# Patient Record
Sex: Male | Born: 1960 | Race: White | Hispanic: No | Marital: Married | Smoking: Former smoker
Health system: Southern US, Community
[De-identification: ages and names within clinical notes are randomized; demographics above are authoritative.]

## PROBLEM LIST (undated history)

## (undated) DIAGNOSIS — R0602 Shortness of breath: Secondary | ICD-10-CM

## (undated) DIAGNOSIS — R7303 Prediabetes: Secondary | ICD-10-CM

## (undated) DIAGNOSIS — F419 Anxiety disorder, unspecified: Secondary | ICD-10-CM

## (undated) DIAGNOSIS — K829 Disease of gallbladder, unspecified: Secondary | ICD-10-CM

## (undated) DIAGNOSIS — I10 Essential (primary) hypertension: Secondary | ICD-10-CM

## (undated) DIAGNOSIS — M25569 Pain in unspecified knee: Secondary | ICD-10-CM

## (undated) DIAGNOSIS — E119 Type 2 diabetes mellitus without complications: Secondary | ICD-10-CM

## (undated) DIAGNOSIS — M1990 Unspecified osteoarthritis, unspecified site: Secondary | ICD-10-CM

## (undated) DIAGNOSIS — F329 Major depressive disorder, single episode, unspecified: Secondary | ICD-10-CM

## (undated) DIAGNOSIS — G4730 Sleep apnea, unspecified: Secondary | ICD-10-CM

## (undated) DIAGNOSIS — E785 Hyperlipidemia, unspecified: Secondary | ICD-10-CM

## (undated) DIAGNOSIS — F32A Depression, unspecified: Secondary | ICD-10-CM

## (undated) DIAGNOSIS — R5383 Other fatigue: Secondary | ICD-10-CM

## (undated) DIAGNOSIS — E039 Hypothyroidism, unspecified: Secondary | ICD-10-CM

## (undated) DIAGNOSIS — M2550 Pain in unspecified joint: Secondary | ICD-10-CM

## (undated) DIAGNOSIS — M25559 Pain in unspecified hip: Secondary | ICD-10-CM

## (undated) HISTORY — DX: Prediabetes: R73.03

## (undated) HISTORY — PX: APPENDECTOMY: SHX54

## (undated) HISTORY — PX: CHOLECYSTECTOMY: SHX55

## (undated) HISTORY — DX: Disease of gallbladder, unspecified: K82.9

## (undated) HISTORY — DX: Pain in unspecified hip: M25.559

## (undated) HISTORY — DX: Hyperlipidemia, unspecified: E78.5

## (undated) HISTORY — DX: Pain in unspecified knee: M25.569

## (undated) HISTORY — PX: MENISCUS REPAIR: SHX5179

## (undated) HISTORY — DX: Shortness of breath: R06.02

## (undated) HISTORY — DX: Other fatigue: R53.83

## (undated) HISTORY — DX: Pain in unspecified joint: M25.50

## (undated) HISTORY — DX: Sleep apnea, unspecified: G47.30

---

## 1992-02-07 HISTORY — PX: APPENDECTOMY: SHX54

## 1994-02-06 HISTORY — PX: CHOLECYSTECTOMY: SHX55

## 2000-02-07 HISTORY — PX: MENISCUS REPAIR: SHX5179

## 2015-02-23 ENCOUNTER — Other Ambulatory Visit: Payer: Self-pay | Admitting: Orthopedic Surgery

## 2015-02-23 DIAGNOSIS — M25331 Other instability, right wrist: Secondary | ICD-10-CM

## 2015-02-23 DIAGNOSIS — M25531 Pain in right wrist: Secondary | ICD-10-CM

## 2015-03-16 ENCOUNTER — Ambulatory Visit: Admission: RE | Admit: 2015-03-16 | Payer: Managed Care, Other (non HMO) | Source: Ambulatory Visit

## 2017-10-18 NOTE — Patient Instructions (Addendum)
Ryan Hancock  10/18/2017   Your procedure is scheduled IO:NGEXBMWon:Tuesday 10/30/2017   [facility]  [facility]              Report to admitting at  0610  AM    Call this number if you have problems the morning of surgery [phone]               Please bring your CPAP mask and tubing with you to the hospital!   Remember: Do not eat food or drink liquids :After Midnight.   BRUSH YOUR TEETH MORNING OF SURGERY AND RINSE YOUR MOUTH OUT, NO CHEWING GUM CANDY OR MINTS.     Take these medicines the morning of surgery with A SIP OF WATER: Bupropion (Wellbutrin XL), Levothyroxine (Synthroid), Sertraline (Zoloft)  DO NOT TAKE ANY DIABETIC MEDICATIONS DAY OF YOUR SURGERY                               You may not have any metal on your body including hair pins and              piercings  Do not wear jewelry, make-up, lotions, powders or perfumes, deodorant             Men may shave face and neck.   Do not bring valuables to the hospital. [facility] IS NOT             RESPONSIBLE   FOR VALUABLES.  Contacts, dentures or bridgework may not be worn into surgery.  Leave suitcase in the car. After surgery it may be brought to your room.                  Please read over the following fact sheets you were given: _____________________________________________________________________             [facility] - Preparing [facility] Before surgery, you can play an important role.  Because skin is not sterile, your skin needs to be as free of germs as possible.  You can reduce the number of germs on your skin by washing with CHG (chlorahexidine gluconate) soap before surgery.  CHG is an antiseptic cleaner which kills germs and bonds with the skin to continue killing germs even after washing. Please DO NOT use if you have an allergy to CHG or antibacterial soaps.  If your skin becomes reddened/irritated stop using the CHG and inform your nurse when you arrive at Short  Stay. Do not shave (including legs and underarms) for at least 48 hours prior to the first CHG shower.  You may shave your face/neck. Please follow these instructions carefully:  1.  Shower with CHG Soap the night before surgery and the  morning of Surgery.  2.  If you choose to wash your hair, wash your hair first as usual with your  normal  shampoo.  3.  After you shampoo, rinse your hair and body thoroughly to remove the  shampoo.                           4.  Use CHG as you would any other liquid soap.  You can apply chg directly  to the skin and wash  Gently with a scrungie or clean washcloth.  5.  Apply the CHG Soap to your body ONLY FROM THE NECK DOWN.   Do not use on face/ open                           Wound or open sores. Avoid contact with eyes, ears mouth and genitals (private parts).                       Wash face,  Genitals (private parts) with your normal soap.             6.  Wash thoroughly, paying special attention to the area where your surgery  will be performed.  7.  Thoroughly rinse your body with warm water from the neck down.  8.  DO NOT shower/wash with your normal soap after using and rinsing off  the CHG Soap.                9.  Pat yourself dry with a clean towel.            10.  Wear clean pajamas.            11.  Place clean sheets on your bed the night of your first shower and do not  sleep with pets. Day of Surgery : Do not apply any lotions/deodorants the morning of surgery.  Please wear clean clothes to the hospital/surgery center.  FAILURE TO FOLLOW THESE INSTRUCTIONS MAY RESULT IN THE CANCELLATION OF YOUR SURGERY PATIENT SIGNATURE_________________________________  NURSE SIGNATURE__________________________________  ________________________________________________________________________   Ryan Hancock  An incentive spirometer is a tool that can help keep your lungs clear and active. This tool measures how well you are  filling your lungs with each breath. Taking long deep breaths may help reverse or decrease the chance of developing breathing (pulmonary) problems (especially infection) following:  A long period of time when you are unable to move or be active. BEFORE THE PROCEDURE   If the spirometer includes an indicator to show your best effort, your nurse or respiratory therapist will set it to a desired goal.  If possible, sit up straight or lean slightly forward. Try not to slouch.  Hold the incentive spirometer in an upright position. INSTRUCTIONS FOR USE  1. Sit on the edge of your bed if possible, or sit up as far as you can in bed or on a chair. 2. Hold the incentive spirometer in an upright position. 3. Breathe out normally. 4. Place the mouthpiece in your mouth and seal your lips tightly around it. 5. Breathe in slowly and as deeply as possible, raising the piston or the ball toward the top of the column. 6. Hold your breath for 3-5 seconds or for as long as possible. Allow the piston or ball to fall to the bottom of the column. 7. Remove the mouthpiece from your mouth and breathe out normally. 8. Rest for a few seconds and repeat Steps 1 through 7 at least 10 times every 1-2 hours when you are awake. Take your time and take a few normal breaths between deep breaths. 9. The spirometer may include an indicator to show your best effort. Use the indicator as a goal to work toward during each repetition. 10. After each set of 10 deep breaths, practice coughing to be sure your lungs are clear. If you have an incision (the cut made at the time of surgery),  support your incision when coughing by placing a pillow or rolled up towels firmly against it. Once you are able to get out of bed, walk around indoors and cough well. You may stop using the incentive spirometer when instructed by your caregiver.  RISKS AND COMPLICATIONS  Take your time so you do not get dizzy or light-headed.  If you are in pain,  you may need to take or ask for pain medication before doing incentive spirometry. It is harder to take a deep breath if you are having pain. AFTER USE  Rest and breathe slowly and easily.  It can be helpful to keep track of a log of your progress. Your caregiver can provide you with a simple table to help with this. If you are using the spirometer at home, follow these instructions: [place] IF:   You are having difficultly using the spirometer.  You have trouble using the spirometer as often as instructed.  Your pain medication is not giving enough relief while using the spirometer.  You develop fever of 100.5 F (38.1 C) or higher. SEEK IMMEDIATE MEDICAL CARE IF:   You cough up bloody sputum that had not been present before.  You develop fever of 102 F (38.9 C) or greater.  You develop worsening pain at or near the incision site. MAKE SURE YOU:   Understand these instructions.  Will watch your condition.  Will get help right away if you are not doing well or get worse. Document Released: 06/05/2006 Document Revised: 04/17/2011 Document Reviewed: 08/06/2006 Exit[employer] Patient Information 2014 ExitCare, [place].   ________________________________________________________________________  WHAT IS A BLOOD TRANSFUSION? Blood Transfusion Information  A transfusion is the replacement of blood or some of its parts. Blood is made up of multiple cells which provide different functions.  Red blood cells carry oxygen and are used for blood loss replacement.  White blood cells fight against infection.  Platelets control bleeding.  Plasma helps clot blood.  Other blood products are available for specialized needs, such as hemophilia or other clotting disorders. BEFORE THE TRANSFUSION  Who gives blood for transfusions?   Healthy volunteers who are fully evaluated to make sure their blood is safe. This is blood bank blood. Transfusion therapy is the safest it has ever  been in the practice of medicine. Before blood is taken from a donor, a complete history is taken to make sure that person has no history of diseases nor engages in risky social behavior (examples are intravenous drug use or sexual activity with multiple partners). The donor's travel history is screened to minimize risk of transmitting infections, such as malaria. The donated blood is tested for signs of infectious diseases, such as HIV and hepatitis. The blood is then tested to be sure it is compatible with you in order to minimize the chance of a transfusion reaction. If you or a relative donates blood, this is often done in anticipation of surgery and is not appropriate for emergency situations. It takes many days to process the donated blood. RISKS AND COMPLICATIONS Although transfusion therapy is very safe and saves many lives, the main dangers of transfusion include:   Getting an infectious disease.  Developing a transfusion reaction. This is an allergic reaction to something in the blood you were given. Every precaution is taken to prevent this. The decision to have a blood transfusion has been considered carefully by your caregiver before blood is given. Blood is not given unless the benefits outweigh the risks. AFTER THE TRANSFUSION  Right after receiving a blood transfusion, you will usually feel much better and more energetic. This is especially true if your red blood cells have gotten low (anemic). The transfusion raises the level of the red blood cells which carry oxygen, and this usually causes an energy increase.  The nurse administering the transfusion will monitor you carefully for complications. HOME CARE INSTRUCTIONS  No special instructions are needed after a transfusion. You may find your energy is better. Speak with your caregiver about any limitations on activity for underlying diseases you may have. SEEK MEDICAL CARE IF:   Your condition is not improving after your  transfusion.  You develop redness or irritation at the intravenous (IV) site. SEEK IMMEDIATE MEDICAL CARE IF:  Any of the following symptoms occur over the next 12 hours:  Shaking chills.  You have a temperature by mouth above 102 F (38.9 C), not controlled by medicine.  Chest, back, or muscle pain.  People around you feel you are not acting correctly or are confused.  Shortness of breath or difficulty breathing.  Dizziness and fainting.  You get a rash or develop hives.  You have a decrease in urine output.  Your urine turns a dark color or changes to pink, red, or brown. Any of the following symptoms occur over the next 10 days:  You have a temperature by mouth above 102 F (38.9 C), not controlled by medicine.  Shortness of breath.  Weakness after normal activity.  The white part of the eye turns yellow (jaundice).  You have a decrease in the amount of urine or are urinating less often.  Your urine turns a dark color or changes to pink, red, or brown. Document Released: 01/21/2000 Document Revised: 04/17/2011 Document Reviewed: 09/09/2007 [facility] Patient Information 2014 [place], [place].  _______________________________________________________________________

## 2017-10-22 ENCOUNTER — Other Ambulatory Visit: Payer: Self-pay

## 2017-10-22 ENCOUNTER — Encounter (HOSPITAL_COMMUNITY): Payer: Self-pay

## 2017-10-22 ENCOUNTER — Encounter (HOSPITAL_COMMUNITY)
Admission: RE | Admit: 2017-10-22 | Discharge: 2017-10-22 | Disposition: A | Discharge status: Home / Self Care | Payer: BLUE CROSS/BLUE SHIELD | Source: Ambulatory Visit | Attending: Orthopedic Surgery | Admitting: Orthopedic Surgery

## 2017-10-22 DIAGNOSIS — I10 Essential (primary) hypertension: Secondary | ICD-10-CM | POA: Insufficient documentation

## 2017-10-22 DIAGNOSIS — Z01818 Encounter for other preprocedural examination: Secondary | ICD-10-CM | POA: Diagnosis present

## 2017-10-22 HISTORY — DX: Hypothyroidism, unspecified: E03.9

## 2017-10-22 HISTORY — DX: Major depressive disorder, single episode, unspecified: F32.9

## 2017-10-22 HISTORY — DX: Anxiety disorder, unspecified: F41.9

## 2017-10-22 HISTORY — DX: Essential (primary) hypertension: I10

## 2017-10-22 HISTORY — DX: Unspecified osteoarthritis, unspecified site: M19.90

## 2017-10-22 HISTORY — DX: Depression, unspecified: F32.A

## 2017-10-22 LAB — BASIC METABOLIC PANEL
Anion gap: 8 (ref 5–15)
BUN: 22 mg/dL — AB (ref 6–20)
CHLORIDE: 108 mmol/L (ref 98–111)
CO2: 28 mmol/L (ref 22–32)
CREATININE: 0.81 mg/dL (ref 0.61–1.24)
Calcium: 10 mg/dL (ref 8.9–10.3)
GFR calc non Af Amer: >60 mL/min (ref >60)
GLUCOSE: 128 mg/dL — AB (ref 70–99)
Potassium: 4.6 mmol/L (ref 3.5–5.1)
SODIUM: 144 mmol/L (ref 135–145)

## 2017-10-22 LAB — CBC
HCT: 41.3 % (ref 39.0–52.0)
Hemoglobin: 13.9 g/dL (ref 13.0–17.0)
MCH: 31.5 pg (ref 26.0–34.0)
MCHC: 33.7 g/dL (ref 30.0–36.0)
MCV: 93.7 fL (ref 78.0–100.0)
PLATELETS: 307 10*3/uL (ref 150–400)
RBC: 4.41 MIL/uL (ref 4.22–5.81)
RDW: 12.6 % (ref 11.5–15.5)
WBC: 6.4 10*3/uL (ref 4.0–10.5)

## 2017-10-22 LAB — SURGICAL PCR SCREEN
MRSA, PCR: NEGATIVE
STAPHYLOCOCCUS AUREUS: NEGATIVE

## 2017-10-22 LAB — ABO/RH: ABO/RH(D): A POS

## 2017-10-29 MED ORDER — DEXTROSE 5 % IV SOLN
3.0000 g | INTRAVENOUS | Status: AC
  Administered 2017-10-30: 3 g via INTRAVENOUS
  Filled 2017-10-29: qty 3

## 2017-10-29 NOTE — H&P (Signed)
TOTAL HIP ADMISSION H&P  Patient is admitted for left total hip arthroplasty, anterior approach.  Subjective:  Chief Complaint:   Left hip primary OA / pain  HPI: Ryan Hancock, 57 y.o. male, has a history of pain and functional disability in the left hip(s) due to arthritis and patient has failed non-surgical conservative treatments for greater than 12 weeks to include NSAID's and/or analgesics, corticosteriod injections and activity modification.  Onset of symptoms was gradual starting 8+ years ago with gradually worsening course since that time.The patient noted no past surgery on the left hip(s).  Patient currently rates pain in the left hip at 7 out of 10 with activity. Patient has night pain, worsening of pain with activity and weight bearing, trendelenberg gait, pain that interfers with activities of daily living and pain with passive range of motion. Patient has evidence of periarticular osteophytes and joint space narrowing by imaging studies. This condition presents safety issues increasing the risk of falls.   There is no current active infection.  Risks, benefits and expectations were discussed with the patient.  Risks including but not limited to the risk of anesthesia, blood clots, nerve damage, blood vessel damage, failure of the prosthesis, infection and up to and including death.  Patient understand the risks, benefits and expectations and wishes to proceed with surgery.   PCP: Dois Davenportichter, Karen L, MD  D/C Plans:       Home  Post-op Meds:       No Rx given   Tranexamic Acid:      To be given - IV   Decadron:      Is to be given  FYI:     ASA  Norco  CPAP  DME:   Rx given for - RW   PT:   No PT     Past Medical History:  Diagnosis Date  . Anxiety   . Arthritis   . Depression   . Hypertension   . Hypothyroidism     Past Surgical History:  Procedure Laterality Date  . APPENDECTOMY    . CHOLECYSTECTOMY    . MENISCUS REPAIR     right knee    No current  facility-administered medications for this encounter.    Current Outpatient Medications  Medication Sig Dispense Refill Last Dose  . acetaminophen (TYLENOL) 500 MG tablet Take 1,000 mg by mouth every 6 (six) hours as needed for moderate pain.   Past Week at Unknown time  . buPROPion (WELLBUTRIN XL) 150 MG 24 hr tablet Take 150 mg by mouth daily.   10/22/2017 at Unknown time  . ibuprofen (ADVIL,MOTRIN) 200 MG tablet Take 800 mg by mouth every 6 (six) hours as needed for moderate pain.   10/21/2017 at Unknown time  . levothyroxine (SYNTHROID, LEVOTHROID) 50 MCG tablet Take 50 mcg by mouth daily before breakfast.   10/22/2017 at 6:15am  . lisinopril (PRINIVIL,ZESTRIL) 10 MG tablet Take 10 mg by mouth daily.   10/22/2017 at Unknown time  . sertraline (ZOLOFT) 100 MG tablet Take 100 mg by mouth daily.    10/22/2017 at Unknown time  . TURMERIC PO Take 2 tablets by mouth every morning.   Past Week at Unknown time   No Known Allergies  Social History   Tobacco Use  . Smoking status: Former Smoker    Types: Cigarettes    Last attempt to quit: 03/20/1984    Years since quitting: 33.6  . Smokeless tobacco: Never Used  Substance Use Topics  . Alcohol use:  Yes    Comment: occassionally       Review of Systems  Constitutional: Negative.   HENT: Negative.   Eyes: Negative.   Respiratory: Negative.   Cardiovascular: Negative.   Gastrointestinal: Negative.   Genitourinary: Negative.   Musculoskeletal: Positive for joint pain.  Skin: Negative.   Neurological: Negative.   Endo/Heme/Allergies: Negative.   Psychiatric/Behavioral: Positive for depression. The patient is nervous/anxious.     Objective:  Physical Exam  Constitutional: He is oriented to person, place, and time. He appears well-developed.  HENT:  Head: Normocephalic.  Eyes: Pupils are equal, round, and reactive to light.  Neck: Neck supple. No JVD present. No tracheal deviation present. No thyromegaly present.  Cardiovascular:  Normal rate, regular rhythm and intact distal pulses.  Respiratory: Effort normal and breath sounds normal. No respiratory distress. He has no wheezes.  GI: Soft. There is no tenderness. There is no guarding.  Musculoskeletal:       Left hip: He exhibits decreased range of motion, decreased strength, tenderness and bony tenderness. He exhibits no swelling, no deformity and no laceration.  Lymphadenopathy:    He has no cervical adenopathy.  Neurological: He is alert and oriented to person, place, and time.  Skin: Skin is warm and dry.  Psychiatric: He has a normal mood and affect.      Labs:  Estimated body mass index is 38.71 kg/m as calculated from the following:   Height as of 10/22/17: 6' (1.829 m).   Weight as of 10/22/17: 129.5 kg.   Imaging Review Plain radiographs demonstrate severe degenerative joint disease of the left hip. The bone quality appears to be good for age and reported activity level.    Preoperative templating of the joint replacement has been completed, documented, and submitted to the Operating Room personnel in order to optimize intra-operative equipment management.     Assessment/Plan:  End stage arthritis, left hip  The patient history, physical examination, clinical judgement of the provider and imaging studies are consistent with end stage degenerative joint disease of the left hip and total hip arthroplasty is deemed medically necessary. The treatment options including medical management, injection therapy, arthroscopy and arthroplasty were discussed at length. The risks and benefits of total hip arthroplasty were presented and reviewed. The risks due to aseptic loosening, infection, stiffness, dislocation/subluxation,  thromboembolic complications and other imponderables were discussed.  The patient acknowledged the explanation, agreed to proceed with the plan and consent was signed. Patient is being admitted for inpatient treatment for surgery, pain  control, PT, OT, prophylactic antibiotics, VTE prophylaxis, progressive ambulation and ADL's and discharge planning.The patient is planning to be discharged home.      Anastasio Auerbach Jerrico Covello   PA-C  10/29/2017, 9:31 AM

## 2017-10-30 ENCOUNTER — Ambulatory Visit (HOSPITAL_COMMUNITY): Payer: BLUE CROSS/BLUE SHIELD | Admitting: Anesthesiology

## 2017-10-30 ENCOUNTER — Ambulatory Visit (HOSPITAL_COMMUNITY): Payer: BLUE CROSS/BLUE SHIELD

## 2017-10-30 ENCOUNTER — Encounter (HOSPITAL_COMMUNITY): Payer: Self-pay | Admitting: *Deleted

## 2017-10-30 ENCOUNTER — Other Ambulatory Visit: Payer: Self-pay

## 2017-10-30 ENCOUNTER — Encounter (HOSPITAL_COMMUNITY)
Admission: RE | Disposition: A | Discharge status: Home / Self Care | Payer: Self-pay | Source: Ambulatory Visit | Attending: Orthopedic Surgery

## 2017-10-30 ENCOUNTER — Observation Stay (HOSPITAL_COMMUNITY)
Admission: RE | Admit: 2017-10-30 | Discharge: 2017-10-31 | Disposition: A | Discharge status: Home / Self Care | Payer: BLUE CROSS/BLUE SHIELD | Source: Ambulatory Visit | Attending: Orthopedic Surgery | Admitting: Orthopedic Surgery

## 2017-10-30 ENCOUNTER — Inpatient Hospital Stay (HOSPITAL_COMMUNITY): Payer: BLUE CROSS/BLUE SHIELD

## 2017-10-30 DIAGNOSIS — M1612 Unilateral primary osteoarthritis, left hip: Principal | ICD-10-CM | POA: Insufficient documentation

## 2017-10-30 DIAGNOSIS — F329 Major depressive disorder, single episode, unspecified: Secondary | ICD-10-CM | POA: Diagnosis not present

## 2017-10-30 DIAGNOSIS — E669 Obesity, unspecified: Secondary | ICD-10-CM | POA: Diagnosis present

## 2017-10-30 DIAGNOSIS — Z96649 Presence of unspecified artificial hip joint: Secondary | ICD-10-CM

## 2017-10-30 DIAGNOSIS — Z96642 Presence of left artificial hip joint: Secondary | ICD-10-CM

## 2017-10-30 DIAGNOSIS — E039 Hypothyroidism, unspecified: Secondary | ICD-10-CM | POA: Diagnosis not present

## 2017-10-30 DIAGNOSIS — Z6838 Body mass index (BMI) 38.0-38.9, adult: Secondary | ICD-10-CM | POA: Insufficient documentation

## 2017-10-30 DIAGNOSIS — Z79899 Other long term (current) drug therapy: Secondary | ICD-10-CM | POA: Insufficient documentation

## 2017-10-30 DIAGNOSIS — F419 Anxiety disorder, unspecified: Secondary | ICD-10-CM | POA: Diagnosis not present

## 2017-10-30 DIAGNOSIS — Z87891 Personal history of nicotine dependence: Secondary | ICD-10-CM | POA: Diagnosis not present

## 2017-10-30 DIAGNOSIS — Z79890 Hormone replacement therapy (postmenopausal): Secondary | ICD-10-CM | POA: Insufficient documentation

## 2017-10-30 DIAGNOSIS — I10 Essential (primary) hypertension: Secondary | ICD-10-CM | POA: Diagnosis not present

## 2017-10-30 HISTORY — PX: TOTAL HIP ARTHROPLASTY: SHX124

## 2017-10-30 LAB — TYPE AND SCREEN
ABO/RH(D): A POS
Antibody Screen: NEGATIVE

## 2017-10-30 SURGERY — ARTHROPLASTY, HIP, TOTAL, ANTERIOR APPROACH
Anesthesia: Spinal | Site: Hip | Laterality: Left

## 2017-10-30 MED ORDER — FERROUS SULFATE 325 (65 FE) MG PO TABS
325.0000 mg | ORAL_TABLET | Freq: Three times a day (TID) | ORAL | Status: DC
  Administered 2017-10-31: 325 mg via ORAL
  Filled 2017-10-30: qty 1

## 2017-10-30 MED ORDER — PHENOL 1.4 % MT LIQD
1.0000 | OROMUCOSAL | Status: DC | PRN
  Filled 2017-10-30: qty 177

## 2017-10-30 MED ORDER — METHOCARBAMOL 500 MG PO TABS: 500.0000 mg | ORAL_TABLET | Freq: Four times a day (QID) | ORAL | 0 refills | Status: DC | PRN

## 2017-10-30 MED ORDER — ONDANSETRON HCL 4 MG/2ML IJ SOLN
INTRAMUSCULAR | Status: AC
  Filled 2017-10-30: qty 2

## 2017-10-30 MED ORDER — METHOCARBAMOL 500 MG PO TABS
500.0000 mg | ORAL_TABLET | Freq: Four times a day (QID) | ORAL | Status: DC | PRN
  Administered 2017-10-30 – 2017-10-31 (×2): 500 mg via ORAL
  Filled 2017-10-30 (×2): qty 1

## 2017-10-30 MED ORDER — BUPROPION HCL ER (XL) 150 MG PO TB24
150.0000 mg | ORAL_TABLET | Freq: Every day | ORAL | Status: DC
  Administered 2017-10-31: 150 mg via ORAL
  Filled 2017-10-30: qty 1

## 2017-10-30 MED ORDER — FENTANYL CITRATE (PF) 100 MCG/2ML IJ SOLN
INTRAMUSCULAR | Status: DC | PRN
  Administered 2017-10-30: 100 ug via INTRAVENOUS

## 2017-10-30 MED ORDER — MORPHINE SULFATE (PF) 2 MG/ML IV SOLN: 0.5000 mg | INTRAVENOUS | Status: DC | PRN

## 2017-10-30 MED ORDER — POLYETHYLENE GLYCOL 3350 17 G PO PACK
17.0000 g | PACK | Freq: Two times a day (BID) | ORAL | Status: DC
  Filled 2017-10-30 (×2): qty 1

## 2017-10-30 MED ORDER — CEFAZOLIN SODIUM-DEXTROSE 2-4 GM/100ML-% IV SOLN
2.0000 g | Freq: Four times a day (QID) | INTRAVENOUS | Status: AC
  Administered 2017-10-30 (×2): 2 g via INTRAVENOUS
  Filled 2017-10-30 (×2): qty 100

## 2017-10-30 MED ORDER — PROPOFOL 10 MG/ML IV BOLUS
INTRAVENOUS | Status: AC
  Filled 2017-10-30: qty 20

## 2017-10-30 MED ORDER — PROMETHAZINE HCL 25 MG/ML IJ SOLN
6.2500 mg | INTRAMUSCULAR | Status: DC | PRN
  Administered 2017-10-30: 6.25 mg via INTRAVENOUS

## 2017-10-30 MED ORDER — TRANEXAMIC ACID 1000 MG/10ML IV SOLN
1000.0000 mg | Freq: Once | INTRAVENOUS | Status: AC
  Administered 2017-10-30: 1000 mg via INTRAVENOUS
  Filled 2017-10-30: qty 1000

## 2017-10-30 MED ORDER — CHLORHEXIDINE GLUCONATE 4 % EX LIQD: 60.0000 mL | Freq: Once | CUTANEOUS | Status: DC

## 2017-10-30 MED ORDER — PROMETHAZINE HCL 25 MG/ML IJ SOLN
INTRAMUSCULAR | Status: AC
  Filled 2017-10-30: qty 1

## 2017-10-30 MED ORDER — ACETAMINOPHEN 10 MG/ML IV SOLN
1000.0000 mg | Freq: Once | INTRAVENOUS | Status: AC
  Administered 2017-10-30: 1000 mg via INTRAVENOUS

## 2017-10-30 MED ORDER — ACETAMINOPHEN 10 MG/ML IV SOLN
INTRAVENOUS | Status: AC
  Filled 2017-10-30: qty 100

## 2017-10-30 MED ORDER — TRANEXAMIC ACID 1000 MG/10ML IV SOLN
1000.0000 mg | INTRAVENOUS | Status: AC
  Administered 2017-10-30: 1000 mg via INTRAVENOUS
  Filled 2017-10-30: qty 10

## 2017-10-30 MED ORDER — METOCLOPRAMIDE HCL 5 MG/ML IJ SOLN: 5.0000 mg | Freq: Three times a day (TID) | INTRAMUSCULAR | Status: DC | PRN

## 2017-10-30 MED ORDER — DIPHENHYDRAMINE HCL 12.5 MG/5ML PO ELIX: 12.5000 mg | ORAL_SOLUTION | ORAL | Status: DC | PRN

## 2017-10-30 MED ORDER — HYDROMORPHONE HCL 1 MG/ML IJ SOLN
0.2500 mg | INTRAMUSCULAR | Status: DC | PRN
  Administered 2017-10-30: 0.25 mg via INTRAVENOUS

## 2017-10-30 MED ORDER — MIDAZOLAM HCL 2 MG/2ML IJ SOLN
INTRAMUSCULAR | Status: AC
  Filled 2017-10-30: qty 2

## 2017-10-30 MED ORDER — KETOROLAC TROMETHAMINE 30 MG/ML IJ SOLN
INTRAMUSCULAR | Status: AC
  Filled 2017-10-30: qty 1

## 2017-10-30 MED ORDER — ONDANSETRON HCL 4 MG/2ML IJ SOLN
INTRAMUSCULAR | Status: DC | PRN
  Administered 2017-10-30 (×2): 4 mg via INTRAVENOUS

## 2017-10-30 MED ORDER — ASPIRIN 81 MG PO CHEW
81.0000 mg | CHEWABLE_TABLET | Freq: Two times a day (BID) | ORAL | Status: DC
  Administered 2017-10-30 – 2017-10-31 (×2): 81 mg via ORAL
  Filled 2017-10-30 (×2): qty 1

## 2017-10-30 MED ORDER — METOCLOPRAMIDE HCL 5 MG PO TABS: 5.0000 mg | ORAL_TABLET | Freq: Three times a day (TID) | ORAL | Status: DC | PRN

## 2017-10-30 MED ORDER — HYDROMORPHONE HCL 1 MG/ML IJ SOLN
INTRAMUSCULAR | Status: AC
  Filled 2017-10-30: qty 1

## 2017-10-30 MED ORDER — BISACODYL 10 MG RE SUPP: 10.0000 mg | Freq: Every day | RECTAL | Status: DC | PRN

## 2017-10-30 MED ORDER — BUPIVACAINE IN DEXTROSE 0.75-8.25 % IT SOLN
INTRATHECAL | Status: DC | PRN
  Administered 2017-10-30: 2 mL via INTRATHECAL

## 2017-10-30 MED ORDER — POLYETHYLENE GLYCOL 3350 17 G PO PACK: 17.0000 g | PACK | Freq: Two times a day (BID) | ORAL | 0 refills | Status: DC

## 2017-10-30 MED ORDER — DOCUSATE SODIUM 100 MG PO CAPS
100.0000 mg | ORAL_CAPSULE | Freq: Two times a day (BID) | ORAL | Status: DC
  Administered 2017-10-30 – 2017-10-31 (×2): 100 mg via ORAL
  Filled 2017-10-30 (×2): qty 1

## 2017-10-30 MED ORDER — ONDANSETRON HCL 4 MG PO TABS: 4.0000 mg | ORAL_TABLET | Freq: Four times a day (QID) | ORAL | Status: DC | PRN

## 2017-10-30 MED ORDER — KETOROLAC TROMETHAMINE 30 MG/ML IJ SOLN
30.0000 mg | Freq: Once | INTRAMUSCULAR | Status: AC
  Administered 2017-10-30: 30 mg via INTRAVENOUS

## 2017-10-30 MED ORDER — ALUM & MAG HYDROXIDE-SIMETH 200-200-20 MG/5ML PO SUSP: 15.0000 mL | ORAL | Status: DC | PRN

## 2017-10-30 MED ORDER — STERILE WATER FOR IRRIGATION IR SOLN
Status: DC | PRN
  Administered 2017-10-30: 2000 mL

## 2017-10-30 MED ORDER — PROPOFOL 500 MG/50ML IV EMUL
INTRAVENOUS | Status: DC | PRN
  Administered 2017-10-30: 50 ug/kg/min via INTRAVENOUS

## 2017-10-30 MED ORDER — MIDAZOLAM HCL 2 MG/2ML IJ SOLN
INTRAMUSCULAR | Status: DC | PRN
  Administered 2017-10-30: 2 mg via INTRAVENOUS
  Administered 2017-10-30 (×4): 0.5 mg via INTRAVENOUS

## 2017-10-30 MED ORDER — CELECOXIB 200 MG PO CAPS
200.0000 mg | ORAL_CAPSULE | Freq: Two times a day (BID) | ORAL | Status: DC
  Administered 2017-10-30 – 2017-10-31 (×3): 200 mg via ORAL
  Filled 2017-10-30 (×3): qty 1

## 2017-10-30 MED ORDER — ASPIRIN 81 MG PO CHEW: 81.0000 mg | CHEWABLE_TABLET | Freq: Two times a day (BID) | ORAL | 0 refills | Status: AC

## 2017-10-30 MED ORDER — LACTATED RINGERS IV SOLN
INTRAVENOUS | Status: DC
  Administered 2017-10-30 (×2): via INTRAVENOUS

## 2017-10-30 MED ORDER — PROPOFOL 10 MG/ML IV BOLUS
INTRAVENOUS | Status: AC
  Filled 2017-10-30: qty 60

## 2017-10-30 MED ORDER — DEXAMETHASONE SODIUM PHOSPHATE 10 MG/ML IJ SOLN
10.0000 mg | Freq: Once | INTRAMUSCULAR | Status: AC
  Administered 2017-10-31: 10 mg via INTRAVENOUS
  Filled 2017-10-30: qty 1

## 2017-10-30 MED ORDER — MAGNESIUM CITRATE PO SOLN: 1.0000 | Freq: Once | ORAL | Status: DC | PRN

## 2017-10-30 MED ORDER — DEXAMETHASONE SODIUM PHOSPHATE 10 MG/ML IJ SOLN
10.0000 mg | Freq: Once | INTRAMUSCULAR | Status: AC
  Administered 2017-10-30: 10 mg via INTRAVENOUS

## 2017-10-30 MED ORDER — DEXAMETHASONE SODIUM PHOSPHATE 10 MG/ML IJ SOLN
INTRAMUSCULAR | Status: AC
  Filled 2017-10-30: qty 1

## 2017-10-30 MED ORDER — FENTANYL CITRATE (PF) 100 MCG/2ML IJ SOLN
INTRAMUSCULAR | Status: AC
  Filled 2017-10-30: qty 2

## 2017-10-30 MED ORDER — SODIUM CHLORIDE 0.9 % IR SOLN
Status: DC | PRN
  Administered 2017-10-30: 1000 mL

## 2017-10-30 MED ORDER — HYDROCODONE-ACETAMINOPHEN 7.5-325 MG PO TABS: 1.0000 | ORAL_TABLET | ORAL | Status: DC | PRN

## 2017-10-30 MED ORDER — SERTRALINE HCL 100 MG PO TABS
100.0000 mg | ORAL_TABLET | Freq: Every day | ORAL | Status: DC
  Administered 2017-10-31: 100 mg via ORAL
  Filled 2017-10-30: qty 1

## 2017-10-30 MED ORDER — DOCUSATE SODIUM 100 MG PO CAPS: 100.0000 mg | ORAL_CAPSULE | Freq: Two times a day (BID) | ORAL | 0 refills | Status: DC

## 2017-10-30 MED ORDER — MENTHOL 3 MG MT LOZG: 1.0000 | LOZENGE | OROMUCOSAL | Status: DC | PRN

## 2017-10-30 MED ORDER — SODIUM CHLORIDE 0.9 % IV SOLN
INTRAVENOUS | Status: DC
  Administered 2017-10-30 – 2017-10-31 (×3): via INTRAVENOUS

## 2017-10-30 MED ORDER — ACETAMINOPHEN 325 MG PO TABS: 325.0000 mg | ORAL_TABLET | Freq: Four times a day (QID) | ORAL | Status: DC | PRN

## 2017-10-30 MED ORDER — FERROUS SULFATE 325 (65 FE) MG PO TABS: 325.0000 mg | ORAL_TABLET | Freq: Three times a day (TID) | ORAL | 3 refills | Status: DC

## 2017-10-30 MED ORDER — ACETAMINOPHEN 10 MG/ML IV SOLN: 1000.0000 mg | Freq: Once | INTRAVENOUS | Status: DC | PRN

## 2017-10-30 MED ORDER — ONDANSETRON HCL 4 MG/2ML IJ SOLN: 4.0000 mg | Freq: Four times a day (QID) | INTRAMUSCULAR | Status: DC | PRN

## 2017-10-30 MED ORDER — METHOCARBAMOL 500 MG IVPB - SIMPLE MED
INTRAVENOUS | Status: AC
  Filled 2017-10-30: qty 50

## 2017-10-30 MED ORDER — LEVOTHYROXINE SODIUM 50 MCG PO TABS
50.0000 ug | ORAL_TABLET | Freq: Every day | ORAL | Status: DC
  Administered 2017-10-31: 50 ug via ORAL
  Filled 2017-10-30: qty 1

## 2017-10-30 MED ORDER — METHOCARBAMOL 500 MG IVPB - SIMPLE MED
500.0000 mg | Freq: Four times a day (QID) | INTRAVENOUS | Status: DC | PRN
  Administered 2017-10-30: 500 mg via INTRAVENOUS
  Filled 2017-10-30: qty 50

## 2017-10-30 MED ORDER — HYDROCODONE-ACETAMINOPHEN 7.5-325 MG PO TABS: 1.0000 | ORAL_TABLET | ORAL | 0 refills | Status: DC | PRN

## 2017-10-30 MED ORDER — HYDROCODONE-ACETAMINOPHEN 5-325 MG PO TABS
1.0000 | ORAL_TABLET | ORAL | Status: DC | PRN
  Administered 2017-10-30: 1 via ORAL
  Administered 2017-10-30 – 2017-10-31 (×2): 2 via ORAL
  Filled 2017-10-30: qty 1
  Filled 2017-10-30 (×2): qty 2

## 2017-10-30 MED ORDER — PROPOFOL 10 MG/ML IV BOLUS
INTRAVENOUS | Status: DC | PRN
  Administered 2017-10-30 (×3): 20 mg via INTRAVENOUS

## 2017-10-30 SURGICAL SUPPLY — 41 items
BAG DECANTER FOR FLEXI CONT (MISCELLANEOUS) IMPLANT
BAG ZIPLOCK 12X15 (MISCELLANEOUS) IMPLANT
BLADE SAG 18X100X1.27 (BLADE) ×2 IMPLANT
COVER PERINEAL POST (MISCELLANEOUS) ×2 IMPLANT
COVER SURGICAL LIGHT HANDLE (MISCELLANEOUS) ×2 IMPLANT
CUP ACETBLR 54 OD PINNACLE (Hips) ×2 IMPLANT
DERMABOND ADVANCED (GAUZE/BANDAGES/DRESSINGS) ×1
DERMABOND ADVANCED .7 DNX12 (GAUZE/BANDAGES/DRESSINGS) ×1 IMPLANT
DRAPE STERI IOBAN 125X83 (DRAPES) ×2 IMPLANT
DRAPE U-SHAPE 47X51 STRL (DRAPES) ×4 IMPLANT
DRESSING AQUACEL AG SP 3.5X10 (GAUZE/BANDAGES/DRESSINGS) ×1 IMPLANT
DRSG AQUACEL AG SP 3.5X10 (GAUZE/BANDAGES/DRESSINGS) ×2
DURAPREP 26ML APPLICATOR (WOUND CARE) ×2 IMPLANT
ELECT REM PT RETURN 15FT ADLT (MISCELLANEOUS) ×2 IMPLANT
ELIMINATOR HOLE APEX DEPUY (Hips) ×2 IMPLANT
GLOVE BIOGEL M STRL SZ7.5 (GLOVE) ×4 IMPLANT
GLOVE BIOGEL PI IND STRL 7.5 (GLOVE) ×2 IMPLANT
GLOVE BIOGEL PI IND STRL 8.5 (GLOVE) IMPLANT
GLOVE BIOGEL PI INDICATOR 7.5 (GLOVE) ×2
GLOVE BIOGEL PI INDICATOR 8.5 (GLOVE)
GLOVE ECLIPSE 8.0 STRL XLNG CF (GLOVE) IMPLANT
GLOVE ORTHO TXT STRL SZ7.5 (GLOVE) ×2 IMPLANT
GOWN STRL REUS W/TWL 2XL LVL3 (GOWN DISPOSABLE) ×2 IMPLANT
GOWN STRL REUS W/TWL LRG LVL3 (GOWN DISPOSABLE) ×2 IMPLANT
HEAD CERAMIC DELTA 36 PLUS 1.5 (Hips) ×2 IMPLANT
HOLDER FOLEY CATH W/STRAP (MISCELLANEOUS) ×2 IMPLANT
LINER NEUTRAL 54X36MM PLUS 4 (Hips) ×2 IMPLANT
PACK ANTERIOR HIP CUSTOM (KITS) ×2 IMPLANT
SCREW 6.5MMX25MM (Screw) ×2 IMPLANT
STEM TRI LOC BPS GRIPTON SZ 5 (Hips) ×1 IMPLANT
SUT MNCRL AB 4-0 PS2 18 (SUTURE) ×2 IMPLANT
SUT STRATAFIX 0 PDS 27 VIOLET (SUTURE) ×2
SUT VIC AB 1 CT1 36 (SUTURE) ×6 IMPLANT
SUT VIC AB 2-0 CT1 27 (SUTURE) ×2
SUT VIC AB 2-0 CT1 TAPERPNT 27 (SUTURE) ×2 IMPLANT
SUTURE STRATFX 0 PDS 27 VIOLET (SUTURE) ×1 IMPLANT
TRAY FOLEY CATH 14FR (SET/KITS/TRAYS/PACK) ×2 IMPLANT
TRAY FOLEY MTR SLVR 16FR STAT (SET/KITS/TRAYS/PACK) ×2 IMPLANT
TRI LOC BPS W GRIPTON SZ 5 (Hips) ×2 IMPLANT
WATER STERILE IRR 1000ML POUR (IV SOLUTION) ×4 IMPLANT
YANKAUER SUCT BULB TIP 10FT TU (MISCELLANEOUS) ×2 IMPLANT

## 2017-10-30 NOTE — Op Note (Signed)
NAME:  Ryan Hancock                ACCOUNT NO.: [id number]      MEDICAL RECORD NO.: [id number]      [facility]      PHYSICIAN:  Shelda PalMatthew D Odester Nilson  DATE OF BIRTH:  03/10/1960     DATE OF PROCEDURE:  10/30/2017                                 OPERATIVE REPORT         PREOPERATIVE DIAGNOSIS: Left  hip osteoarthritis.      POSTOPERATIVE DIAGNOSIS:  Left hip osteoarthritis.      PROCEDURE:  Left total hip replacement through an anterior approach   utilizing DePuy THR system, component size 54mm pinnacle cup, a size 36+4 neutral   Altrex liner, a size 5 Hi Tri Lock stem with a 36+1.5 delta ceramic   ball.      SURGEON:  Madlyn FrankelMatthew D. Charlann Boxerlin, M.D.      ASSISTANT:  Skip MayerBlair Roberts, PA-C     ANESTHESIA:  Spinal.      SPECIMENS:  None.      COMPLICATIONS:  None.      BLOOD LOSS:  700 cc     DRAINS:  None.      INDICATION OF THE PROCEDURE:  Ryan Hancock is a 57 y.o. male who had   presented to office for evaluation of left hip pain.  Radiographs revealed   progressive degenerative changes with bone-on-bone   articulation of the  hip joint, including subchondral cystic changes and osteophytes.  The patient had painful limited range of   motion significantly affecting their overall quality of life and function.  The patient was failing to    respond to conservative measures including medications and/or injections and activity modification and at this point was ready   to proceed with more definitive measures.  Consent was obtained for   benefit of pain relief.  Specific risks of infection, DVT, component   failure, dislocation, neurovascular injury, and need for revision surgery were reviewed in the office as well discussion of   the anterior versus posterior approach were reviewed.     PROCEDURE IN DETAIL:  The patient was brought to operative theater.   Once adequate anesthesia, preoperative antibiotics, 2 gm of Ancef, 1 gm of Tranexamic Acid, and 10 mg of  Decadron were administered, the patient was positioned supine on the [employer] table.  Once the patient was safely positioned with adequate padding of boney prominences we predraped out the hip, and used fluoroscopy to confirm orientation of the pelvis.      The left hip was then prepped and draped from proximal iliac crest to   mid thigh with a shower curtain technique.      Time-out was performed identifying the patient, planned procedure, and the appropriate extremity.     An incision was then made 2 cm lateral to the   anterior superior iliac spine extending over the orientation of the   tensor fascia lata muscle and sharp dissection was carried down to the   fascia of the muscle.      The fascia was then incised.  The muscle belly was identified and swept   laterally and retractor placed along the superior neck.  Following   cauterization of the circumflex vessels and removing some pericapsular  fat, a second cobra retractor was placed on the inferior neck.  A T-capsulotomy was made along the line of the   superior neck to the trochanteric fossa, then extended proximally and   distally.  Tag sutures were placed and the retractors were then placed   intracapsular.  We then identified the trochanteric fossa and   orientation of my neck cut and then made a neck osteotomy with the femur on traction.  The femoral   head was removed without difficulty or complication.  Traction was let   off and retractors were placed posterior and anterior around the   acetabulum.      The labrum and foveal tissue were debrided.  I began reaming with a 46 mm   reamer and reamed up to 53 mm reamer with good bony bed preparation and a 54 mm  cup was chosen.  The final 54 mm Pinnacle cup was then impacted under fluoroscopy to confirm the depth of penetration and orientation with respect to   Abduction and forward flexion.  A screw was placed into the ilium followed by the hole eliminator.  The final   36+4  neutral Altrex liner was impacted with good visualized rim fit.  The cup was positioned anatomically within the acetabular portion of the pelvis.      At this point, the femur was rolled to 100 degrees.  Further capsule was   released off the inferior aspect of the femoral neck.  I then   released the superior capsule proximally.  With the leg in a neutral position the hook was placed laterally   along the femur under the vastus lateralis origin and elevated manually and then held in position using the hook attachment on the bed.  The leg was then extended and adducted with the leg rolled to 100   degrees of external rotation.  Retractors were placed along the medial calcar and posteriorly over the greater trochanter.  Once the proximal femur was fully   exposed, I used a box osteotome to set orientation.  I then began   broaching with the starting chili pepper broach and passed this by hand and then broached up to 5.  With the 5 broach in place I chose a high offset neck and did several trial reductions.  The offset was appropriate, leg lengths   appeared to be equal best matched with the +1.5 head ball trial confirmed radiographically.   Given these findings, I went ahead and dislocated the hip, repositioned all   retractors and positioned the right hip in the extended and abducted position.  The final 5 Hi Tri Lock stem was   chosen and it was impacted down to the level of neck cut.  Based on this   and the trial reductions, a final 36+1.5 delta ceramic ball was chosen and   impacted onto a clean and dry trunnion, and the hip was reduced.  The   hip had been irrigated throughout the case again at this point.  I did   reapproximate the superior capsular leaflet to the anterior leaflet   using #1 Vicryl.  The fascia of the   tensor fascia lata muscle was then reapproximated using #1 Vicryl and #0 Stratafix sutures.  The   remaining wound was closed with 2-0 Vicryl and running 4-0 Monocryl.    The hip was cleaned, dried, and dressed sterilely using Dermabond and   Aquacel dressing.  The patient was then brought   to recovery room  in stable condition tolerating the procedure well.    Skip Mayer, PA-C was present for the entirety of the case involved from   preoperative positioning, perioperative retractor management, general   facilitation of the case, as well as primary wound closure as assistant.            Madlyn Frankel Charlann Boxer, M.D.        10/30/2017 10:18 AM

## 2017-10-30 NOTE — Anesthesia Procedure Notes (Signed)
Spinal  Patient location during procedure: OR Start time: 10/30/2017 8:36 AM End time: 10/30/2017 8:45 AM Staffing Anesthesiologist: Myrtie Soman, MD Performed: anesthesiologist  Preanesthetic Checklist Completed: patient identified, site marked, surgical consent, pre-op evaluation, timeout performed, IV checked, risks and benefits discussed and monitors and equipment checked Spinal Block Patient position: sitting Prep: ChloraPrep Patient monitoring: heart rate, continuous pulse ox and blood pressure Location: L3-4 Injection technique: single-shot Needle Needle type: Sprotte  Needle gauge: 24 G Needle length: 9 cm Additional Notes Expiration date of kit checked and confirmed. Patient tolerated procedure well, without complications.

## 2017-10-30 NOTE — Discharge Instructions (Signed)

## 2017-10-30 NOTE — Anesthesia Preprocedure Evaluation (Addendum)
Anesthesia Evaluation  Patient identified by MRN, date of birth, ID band Patient awake    Reviewed: Allergy & Precautions, NPO status , Patient's Chart, lab work & pertinent test results  Airway Mallampati: II  TM Distance: >3 FB Neck ROM: Full    Dental no notable dental hx.    Pulmonary neg pulmonary ROS, former smoker,    Pulmonary exam normal breath sounds clear to auscultation       Cardiovascular hypertension, Normal cardiovascular exam Rhythm:Regular Rate:Normal     Neuro/Psych negative neurological ROS  negative psych ROS   GI/Hepatic negative GI ROS, Neg liver ROS,   Endo/Other  Hypothyroidism Morbid obesity  Renal/GU negative Renal ROS  negative genitourinary   Musculoskeletal negative musculoskeletal ROS (+)   Abdominal   Peds negative pediatric ROS (+)  Hematology negative hematology ROS (+)   Anesthesia Other Findings   Reproductive/Obstetrics negative OB ROS                            Anesthesia Physical Anesthesia Plan  ASA: III  Anesthesia Plan: Spinal   Post-op Pain Management:    Induction: Intravenous  PONV Risk Score and Plan: Ondansetron and Midazolam  Airway Management Planned: Simple Face Mask and Nasal Cannula  Additional Equipment:   Intra-op Plan:   Post-operative Plan:   Informed Consent: I have reviewed the patients History and Physical, chart, labs and discussed the procedure including the risks, benefits and alternatives for the proposed anesthesia with the patient or authorized representative who has indicated his/her understanding and acceptance.   Dental advisory given  Plan Discussed with: CRNA, Anesthesiologist and Surgeon  Anesthesia Plan Comments:        Anesthesia Quick Evaluation

## 2017-10-30 NOTE — Evaluation (Signed)
Physical Therapy Evaluation Patient Details Name: Ryan Hancock MRN: [id number] DOB: 1960/10/06 Today's Date: 10/30/2017   History of Present Illness  Pt is 57 YO male s/p L DA-THA on 9/24. PMH includes anxiety, depression, HTN, arthritis, R meniscal repair, cholecystectomy.   Clinical Impression   Pt s/p L DA-THA. Pt presents with difficulty performing bed mobility, L hip pain and weakness, and decreased tolerance for ambulation. Pt to benefit from acute PT to address deficits. Pt ambulated hallway distance with RW today. PT to progress mobility as able, and will continue to follow acutely.     Follow Up Recommendations Follow surgeon's recommendation for DC plan and follow-up therapies;Supervision for mobility/OOB(HEP)    Equipment Recommendations  Crutches    Recommendations for Other Services       Precautions / Restrictions Precautions Precautions: Fall Restrictions Weight Bearing Restrictions: No Other Position/Activity Restrictions: WBAT       Mobility  Bed Mobility Overal bed mobility: Needs Assistance Bed Mobility: Supine to Sit     Supine to sit: Min assist;HOB elevated     General bed mobility comments: Min assist for LLE management. Verbal cuing for sequencing, increased time/effort to scoot to EOB.   Transfers Overall transfer level: Needs assistance Equipment used: Rolling walker (2 wheeled) Transfers: Sit to/from Stand Sit to Stand: Min guard;From elevated surface         General transfer comment: Min guard for safety. Verbal cuing for hand placement on RW.   Ambulation/Gait Ambulation/Gait assistance: Min guard Gait Distance (Feet): 60 Feet Assistive device: Rolling walker (2 wheeled) Gait Pattern/deviations: Step-to pattern;Decreased stride length;Decreased weight shift to left;Decreased stance time - left;Antalgic;Trunk flexed Gait velocity: decr    General Gait Details: Min guard for safety. Verbal cuing for sequencing, placement in RW,  turning.   Stairs            Wheelchair Mobility    Modified Rankin (Stroke Patients Only)       Balance Overall balance assessment: Mild deficits observed, not formally tested                                           Pertinent Vitals/Pain Pain Assessment: 0-10 Pain Score: 2  Pain Location: L hip  Pain Descriptors / Indicators: Burning;Sore Pain Intervention(s): Limited activity within patient's tolerance;Ice applied;Monitored during session    Home Living Family/patient expects to be discharged to:: Private residence Living Arrangements: Spouse/significant other Available Help at Discharge: Family;Available PRN/intermittently Type of Home: House Home Access: Stairs to enter Entrance Stairs-Rails: Right;Left;Can reach both Entrance Stairs-Number of Steps: 3 Home Layout: One level(basement, doesn't need to go downstairs) Home Equipment: Walker - standard;Other (comment)(forearm crutches)      Prior Function Level of Independence: Independent               Hand Dominance   Dominant Hand: Right    Extremity/Trunk Assessment   Upper Extremity Assessment Upper Extremity Assessment: Overall WFL for tasks assessed    Lower Extremity Assessment Lower Extremity Assessment: Overall WFL for tasks assessed;LLE deficits/detail LLE Deficits / Details: suspected post-surgical hip weakness; able to perform quad set x3, ankle pumps LLE Sensation: WNL       Communication   Communication: No difficulties  Cognition Arousal/Alertness: Awake/alert Behavior During Therapy: WFL for tasks assessed/performed;Anxious Overall Cognitive Status: Within Functional Limits for tasks assessed  General Comments      Exercises Total Joint Exercises Ankle Circles/Pumps: AROM;Both;5 reps;Seated Heel Slides: AAROM;Left;Supine(2 reps )   Assessment/Plan    PT Assessment Patient needs continued PT  services  PT Problem List Decreased strength;Pain;Decreased range of motion;Decreased activity tolerance;Decreased knowledge of use of DME;Decreased balance;Decreased mobility       PT Treatment Interventions DME instruction;Therapeutic activities;Gait training;Patient/family education;Therapeutic exercise;Stair training;Balance training;Functional mobility training    PT Goals (Current goals can be found in the Care Plan section)  Acute Rehab PT Goals PT Goal Formulation: With patient/family Time For Goal Achievement: 11/13/17 Potential to Achieve Goals: Good    Frequency 7X/week   Barriers to discharge        Co-evaluation               AM-PAC PT "6 Clicks" Daily Activity  Outcome Measure Difficulty turning over in bed (including adjusting bedclothes, sheets and blankets)?: Unable Difficulty moving from lying on back to sitting on the side of the bed? : Unable Difficulty sitting down on and standing up from a chair with arms (e.g., wheelchair, bedside commode, etc,.)?: Unable Help needed moving to and from a bed to chair (including a wheelchair)?: A Little Help needed walking in hospital room?: A Little Help needed climbing 3-5 steps with a railing? : A Little 6 Click Score: 12    End of Session Equipment Utilized During Treatment: Gait belt Activity Tolerance: Patient tolerated treatment well Patient left: in chair;with chair alarm set;with call bell/phone within reach;with family/visitor present;with SCD's reapplied Nurse Communication: Mobility status PT Visit Diagnosis: Other abnormalities of gait and mobility (R26.89);Difficulty in walking, not elsewhere classified (R26.2)    Time: [id number] PT Time Calculation (min) (ACUTE ONLY): 24 min   Charges:   PT Evaluation $PT Eval Low Complexity: 1 Low PT Treatments $Gait Training: 8-22 mins        Nicola PoliceAlexa D Genever Hancock, PT Acute Rehabilitation Services Pager [phone]  Office [phone]  Ryan Shams D Despina Hiddenure 10/30/2017,  7:14 PM

## 2017-10-30 NOTE — Interval H&P Note (Signed)
History and Physical Interval Note:  10/30/2017 7:01 AM  Ryan ReddenHans Igoe  has presented today for surgery, with the diagnosis of Left hip osteoarthritis  The various methods of treatment have been discussed with the patient and family. After consideration of risks, benefits and other options for treatment, the patient has consented to  Procedure(s) with comments: LEFT TOTAL HIP ARTHROPLASTY ANTERIOR APPROACH (Left) - 70 mins as a surgical intervention .  The patient's history has been reviewed, patient examined, no change in status, stable for surgery.  I have reviewed the patient's chart and labs.  Questions were answered to the patient's satisfaction.     Shelda PalMatthew D Ola Raap

## 2017-10-30 NOTE — Anesthesia Postprocedure Evaluation (Signed)
Anesthesia Post Note  Patient: Jerelene ReddenHans Klare  Procedure(s) Performed: LEFT TOTAL HIP ARTHROPLASTY ANTERIOR APPROACH (Left Hip)     Patient location during evaluation: PACU Anesthesia Type: Spinal Level of consciousness: oriented and awake and alert Pain management: pain level controlled Vital Signs Assessment: post-procedure vital signs reviewed and stable Respiratory status: spontaneous breathing, respiratory function stable and patient connected to nasal cannula oxygen Cardiovascular status: blood pressure returned to baseline and stable Postop Assessment: no headache, no backache and no apparent nausea or vomiting Anesthetic complications: no    Last Vitals:  Vitals:   10/30/17 1309 10/30/17 1418  BP: 101/63 115/78  Pulse: 62 69  Resp: 16 17  Temp: (!) 36.4 C 36.5 C  SpO2: 100% 100%    Last Pain:  Vitals:   10/30/17 1418  TempSrc: Oral  PainSc:                  Heitor Steinhoff S

## 2017-10-30 NOTE — Anesthesia Procedure Notes (Signed)
Procedure Name: MAC Date/Time: 10/30/2017 8:43 AM Performed by: Dione Booze, CRNA Pre-anesthesia Checklist: Patient identified, Emergency Drugs available, Suction available and Patient being monitored Patient Re-evaluated:Patient Re-evaluated prior to induction Oxygen Delivery Method: Simple face mask Placement Confirmation: positive ETCO2

## 2017-10-30 NOTE — Transfer of Care (Signed)
Immediate Anesthesia Transfer of Care Note  Patient: Ryan ReddenHans Hancock  Procedure(s) Performed: LEFT TOTAL HIP ARTHROPLASTY ANTERIOR APPROACH (Left Hip)  Patient Location: PACU  Anesthesia Type:Spinal  Level of Consciousness: awake, alert  and oriented  Airway & Oxygen Therapy: Patient Spontanous Breathing and Patient connected to face mask oxygen  Post-op Assessment: Report given to RN and Post -op Vital signs reviewed and stable  Post vital signs: Reviewed and stable  Last Vitals:  Vitals Value Taken Time  BP    Temp    Pulse    Resp    SpO2      Last Pain:  Vitals:   10/30/17 0617  TempSrc: Oral      Patients Stated Pain Goal: 4 (10/30/17 0636)  Complications: No apparent anesthesia complications

## 2017-10-31 ENCOUNTER — Encounter (HOSPITAL_COMMUNITY): Payer: Self-pay | Admitting: Orthopedic Surgery

## 2017-10-31 DIAGNOSIS — E669 Obesity, unspecified: Secondary | ICD-10-CM | POA: Diagnosis present

## 2017-10-31 DIAGNOSIS — Z96649 Presence of unspecified artificial hip joint: Secondary | ICD-10-CM

## 2017-10-31 DIAGNOSIS — M1612 Unilateral primary osteoarthritis, left hip: Secondary | ICD-10-CM | POA: Diagnosis not present

## 2017-10-31 LAB — BASIC METABOLIC PANEL
Anion gap: 5 (ref 5–15)
BUN: 15 mg/dL (ref 6–20)
CHLORIDE: 110 mmol/L (ref 98–111)
CO2: 26 mmol/L (ref 22–32)
Calcium: 8.7 mg/dL — ABNORMAL LOW (ref 8.9–10.3)
Creatinine, Ser: 0.87 mg/dL (ref 0.61–1.24)
GFR calc Af Amer: >60 mL/min (ref >60)
GFR calc non Af Amer: >60 mL/min (ref >60)
GLUCOSE: 135 mg/dL — AB (ref 70–99)
POTASSIUM: 4.3 mmol/L (ref 3.5–5.1)
Sodium: 141 mmol/L (ref 135–145)

## 2017-10-31 LAB — CBC
HCT: 34.5 % — ABNORMAL LOW (ref 39.0–52.0)
HEMOGLOBIN: 11.6 g/dL — AB (ref 13.0–17.0)
MCH: 31.4 pg (ref 26.0–34.0)
MCHC: 33.6 g/dL (ref 30.0–36.0)
MCV: 93.5 fL (ref 78.0–100.0)
Platelets: 293 10*3/uL (ref 150–400)
RBC: 3.69 MIL/uL — AB (ref 4.22–5.81)
RDW: 12.5 % (ref 11.5–15.5)
WBC: 12.9 10*3/uL — ABNORMAL HIGH (ref 4.0–10.5)

## 2017-10-31 NOTE — Progress Notes (Signed)
Physical Therapy Treatment Patient Details Name: Ryan Hancock MRN: [id number] DOB: Jun 20, 1960 Today's Date: 10/31/2017    History of Present Illness Pt is 57 YO male s/p L DA-THA on 9/24. PMH includes anxiety, depression, HTN, arthritis, R meniscal repair, cholecystectomy.     PT Comments    POD # 1 Pt progressing well and has met a safe mobility level to D/C to home with spouse after only one PT session. Pt performed all THR TE's following HEP handout.  Pt educated on use of ICE.    Follow Up Recommendations  Follow surgeon's recommendation for DC plan and follow-up therapies;Supervision for mobility/OOB(HEP)     Equipment Recommendations       Recommendations for Other Services       Precautions / Restrictions Precautions Precautions: Fall Restrictions Weight Bearing Restrictions: No Other Position/Activity Restrictions: WBAT     Mobility  Bed Mobility               General bed mobility comments: OOB in recliner  Transfers Overall transfer level: Needs assistance Equipment used: Rolling walker (2 wheeled) Transfers: Sit to/from Stand Sit to Stand: Supervision         General transfer comment: increased time with good safety cognition  Ambulation/Gait Ambulation/Gait assistance: Supervision Gait Distance (Feet): 180 Feet Assistive device: Rolling walker (2 wheeled) Gait Pattern/deviations: Step-through pattern Gait velocity: decreased   General Gait Details: one VC safety with turns   Stairs Stairs: Yes Stairs assistance: Supervision Stair Management: Two rails;Forwards;Step to pattern Number of Stairs: 3 General stair comments: with spouse present for instruction on safe handling   Wheelchair Mobility    Modified Rankin (Stroke Patients Only)       Balance                                            Cognition Arousal/Alertness: Awake/alert Behavior During Therapy: WFL for tasks assessed/performed Overall Cognitive  Status: Within Functional Limits for tasks assessed                                        Exercises   Total Hip Replacement TE's 10 reps ankle pumps 10 reps knee presses 10 reps heel slides 10 reps SAQ's 10 reps ABD Followed by ICE     General Comments        Pertinent Vitals/Pain Pain Assessment: 0-10 Pain Score: 3  Pain Location: L hip  Pain Descriptors / Indicators: Discomfort;Operative site guarding;Tender Pain Intervention(s): Monitored during session;Patient requesting pain meds-RN notified;Repositioned    Home Living                      Prior Function            PT Goals (current goals can now be found in the care plan section) Progress towards PT goals: Progressing toward goals    Frequency    7X/week      PT Plan Current plan remains appropriate    Co-evaluation              AM-PAC PT "6 Clicks" Daily Activity  Outcome Measure  Difficulty turning over in bed (including adjusting bedclothes, sheets and blankets)?: A Little Difficulty moving from lying on back to sitting on the side of the bed? : A  Little Difficulty sitting down on and standing up from a chair with arms (e.g., wheelchair, bedside commode, etc,.)?: A Little Help needed moving to and from a bed to chair (including a wheelchair)?: A Little Help needed walking in hospital room?: A Little Help needed climbing 3-5 steps with a railing? : A Little 6 Click Score: 18    End of Session Equipment Utilized During Treatment: Gait belt Activity Tolerance: Patient tolerated treatment well Patient left: in chair;with chair alarm set;with call bell/phone within reach;with family/visitor present;with SCD's reapplied Nurse Communication: (pt ready for D/C to home) PT Visit Diagnosis: Other abnormalities of gait and mobility (R26.89);Difficulty in walking, not elsewhere classified (R26.2)     Time: 4854-6270 PT Time Calculation (min) (ACUTE ONLY): 27  min  Charges:  $Gait Training: 8-22 mins $Therapeutic Exercise: 8-22 mins                     Rica Koyanagi  PTA Acute  Rehabilitation Services Pager      [phone] Office      [phone]

## 2017-10-31 NOTE — Plan of Care (Signed)
Patient discharged home in stable condition. Discharge instructions given to patient and wife, both verbalized understanding. IV removed. Rx given.

## 2017-10-31 NOTE — Progress Notes (Signed)
     Subjective: 1 Day Post-Op Procedure(s) (LRB): LEFT TOTAL HIP ARTHROPLASTY ANTERIOR APPROACH (Left)   Patient reports pain as mild, pain controlled.  States that he has taken minimal pain meds and describes his hip as more sore than pain.  No reported events throughout the night. Ready to be discharged home.    Objective:   VITALS:   Vitals:   10/31/17 0121 10/31/17 0541  BP: 125/66 111/72  Pulse: 75 80  Resp: 16 16  Temp: 98.1 F (36.7 C) 98.2 F (36.8 C)  SpO2: 98% 100%    Dorsiflexion/Plantar flexion intact Incision: dressing C/D/I No cellulitis present Compartment soft  LABS Recent Labs    10/31/17 0446  HGB 11.6*  HCT 34.5*  WBC 12.9*  PLT 293    Recent Labs    10/31/17 0446  NA 141  K 4.3  BUN 15  CREATININE 0.87  GLUCOSE 135*     Assessment/Plan: 1 Day Post-Op Procedure(s) (LRB): LEFT TOTAL HIP ARTHROPLASTY ANTERIOR APPROACH (Left) Foley cath d/c'ed Advance diet Up with therapy D/C IV fluids Discharge home with home health Follow up in 2 weeks at [facility]). Follow up with OLIN,Hadiya Spoerl D in 2 weeks.  Contact information:  [facility] [address] [place] [address] [phone]    Obese (BMI 30-39.9) Estimated body mass index is 38.71 kg/m as calculated from the following:   Height as of this encounter: 6' (1.829 m).   Weight as of this encounter: 129.5 kg. Patient also counseled that weight may inhibit the healing process Patient counseled that losing weight will help with future health issues      Anastasio Auerbach. Delorus Langwell   PAC  10/31/2017, 7:54 AM

## 2017-11-02 ENCOUNTER — Emergency Department (HOSPITAL_COMMUNITY)
Admission: EM | Admit: 2017-11-02 | Discharge: 2017-11-03 | Disposition: A | Discharge status: Home / Self Care | Payer: BLUE CROSS/BLUE SHIELD | Attending: Emergency Medicine | Admitting: Emergency Medicine

## 2017-11-02 ENCOUNTER — Encounter (HOSPITAL_COMMUNITY): Payer: Self-pay

## 2017-11-02 ENCOUNTER — Emergency Department (HOSPITAL_COMMUNITY): Payer: BLUE CROSS/BLUE SHIELD

## 2017-11-02 ENCOUNTER — Other Ambulatory Visit: Payer: Self-pay

## 2017-11-02 DIAGNOSIS — Z87891 Personal history of nicotine dependence: Secondary | ICD-10-CM | POA: Insufficient documentation

## 2017-11-02 DIAGNOSIS — L539 Erythematous condition, unspecified: Secondary | ICD-10-CM | POA: Diagnosis not present

## 2017-11-02 DIAGNOSIS — E039 Hypothyroidism, unspecified: Secondary | ICD-10-CM | POA: Insufficient documentation

## 2017-11-02 DIAGNOSIS — Z7982 Long term (current) use of aspirin: Secondary | ICD-10-CM | POA: Diagnosis not present

## 2017-11-02 DIAGNOSIS — R509 Fever, unspecified: Secondary | ICD-10-CM | POA: Insufficient documentation

## 2017-11-02 DIAGNOSIS — Z96642 Presence of left artificial hip joint: Secondary | ICD-10-CM | POA: Diagnosis not present

## 2017-11-02 DIAGNOSIS — Z79899 Other long term (current) drug therapy: Secondary | ICD-10-CM | POA: Diagnosis not present

## 2017-11-02 DIAGNOSIS — I10 Essential (primary) hypertension: Secondary | ICD-10-CM | POA: Diagnosis not present

## 2017-11-02 DIAGNOSIS — R110 Nausea: Secondary | ICD-10-CM | POA: Diagnosis not present

## 2017-11-02 LAB — CBC WITH DIFFERENTIAL/PLATELET
Basophils Absolute: 0 10*3/uL (ref 0.0–0.1)
Basophils Relative: 0 %
Eosinophils Absolute: 0.1 10*3/uL (ref 0.0–0.7)
Eosinophils Relative: 1 %
HEMATOCRIT: 33.4 % — AB (ref 39.0–52.0)
Hemoglobin: 11.3 g/dL — ABNORMAL LOW (ref 13.0–17.0)
LYMPHS ABS: 2.6 10*3/uL (ref 0.7–4.0)
Lymphocytes Relative: 25 %
MCH: 31.7 pg (ref 26.0–34.0)
MCHC: 33.8 g/dL (ref 30.0–36.0)
MCV: 93.8 fL (ref 78.0–100.0)
MONO ABS: 0.8 10*3/uL (ref 0.1–1.0)
MONOS PCT: 8 %
NEUTROS ABS: 6.9 10*3/uL (ref 1.7–7.7)
Neutrophils Relative %: 66 %
Platelets: 321 10*3/uL (ref 150–400)
RBC: 3.56 MIL/uL — ABNORMAL LOW (ref 4.22–5.81)
RDW: 12.8 % (ref 11.5–15.5)
WBC: 10.4 10*3/uL (ref 4.0–10.5)

## 2017-11-02 LAB — COMPREHENSIVE METABOLIC PANEL
ALBUMIN: 3.5 g/dL (ref 3.5–5.0)
ALT: 39 U/L (ref 0–44)
ANION GAP: 11 (ref 5–15)
AST: 38 U/L (ref 15–41)
Alkaline Phosphatase: 74 U/L (ref 38–126)
BUN: 19 mg/dL (ref 6–20)
CHLORIDE: 104 mmol/L (ref 98–111)
CO2: 25 mmol/L (ref 22–32)
Calcium: 9 mg/dL (ref 8.9–10.3)
Creatinine, Ser: 0.82 mg/dL (ref 0.61–1.24)
GFR calc Af Amer: >60 mL/min (ref >60)
GFR calc non Af Amer: >60 mL/min (ref >60)
GLUCOSE: 114 mg/dL — AB (ref 70–99)
Potassium: 3.5 mmol/L (ref 3.5–5.1)
SODIUM: 140 mmol/L (ref 135–145)
Total Bilirubin: 0.8 mg/dL (ref 0.3–1.2)
Total Protein: 6.5 g/dL (ref 6.5–8.1)

## 2017-11-02 LAB — I-STAT CG4 LACTIC ACID, ED: LACTIC ACID, VENOUS: 1.1 mmol/L (ref 0.5–1.9)

## 2017-11-02 LAB — URINALYSIS, ROUTINE W REFLEX MICROSCOPIC
BACTERIA UA: NONE SEEN
Bilirubin Urine: NEGATIVE
Glucose, UA: NEGATIVE mg/dL
Ketones, ur: NEGATIVE mg/dL
Leukocytes, UA: NEGATIVE
Nitrite: NEGATIVE
PH: 6 (ref 5.0–8.0)
Protein, ur: NEGATIVE mg/dL
SPECIFIC GRAVITY, URINE: 1.016 (ref 1.005–1.030)

## 2017-11-02 MED ORDER — FENTANYL CITRATE (PF) 100 MCG/2ML IJ SOLN
50.0000 ug | Freq: Once | INTRAMUSCULAR | Status: AC
  Administered 2017-11-02: 50 ug via INTRAVENOUS
  Filled 2017-11-02: qty 2

## 2017-11-02 NOTE — ED Triage Notes (Signed)
Pt reports fever of 101 at home after L hip replacement on Tuesday. He states that the incision area is hot to touch and feels hard. He also reports that he feels bloated, but has been able to have 2 bowel movements today. Pt was able to ambulate in with his walker.

## 2017-11-02 NOTE — ED Provider Notes (Signed)
[facility]-EMERGENCY DEPT Provider Note   CSN: [id number] Arrival date & time: 11/02/17  2054     History   Chief Complaint Chief Complaint  Patient presents with  . Nausea  . Fever    post op    HPI Ryan Hancock is a 57 y.o. male.  The history is provided by the patient and medical records.  Fever      57 year old male with history of anxiety, arthritis, depression, hypertension, hypothyroidism, presenting to the ED with fever.  Patient underwent left hip replacement on 10/30/2017 with Dr. Constance Goltz.  States procedure went well without any noted complications to his knowledge.  States he has been doing overall fairly well until this evening.  States he started having chills, wife checked his temperature and it was 100.23F, 30 minutes later up to 100F.  States he has noticed today that his leg has looked a little more red in color and his incision feels "hard".  He has left the surgical bandage in place as instructed by his doctor.  Denies any new numbness or weakness of the leg, remains ambulatory with his walker.  He has not had any cough, nasal congestion or urinary symptoms.  Has felt a little nauseated with decreased bowel movements but feels this is from the pain medicine.  He was able to have a bowel movement yesterday and today just smaller in caliber than normal.  He has not had any vomiting.  He is taking baby ASA daily, tylenol for pain during the day, narcotics at night only.  Past Medical History:  Diagnosis Date  . Anxiety   . Arthritis   . Depression   . Hypertension   . Hypothyroidism     Patient Active Problem List   Diagnosis Date Noted  . Obese 10/31/2017  . S/P hip replacement 10/31/2017  . 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 APPENDECTOMY    . CHOLECYSTECTOMY    . MENISCUS REPAIR     right knee  . TOTAL HIP ARTHROPLASTY Left 10/30/2017   Procedure: LEFT TOTAL HIP ARTHROPLASTY ANTERIOR  APPROACH;  Surgeon: Durene Romans, MD;  Location: [facility]S;  Service: Orthopedics;  Laterality: Left;  70 mins        Home Medications    Prior to Admission medications   Medication Sig Start Date End Date Taking? Authorizing Provider  aspirin (ASPIRIN CHILDRENS) 81 MG chewable tablet Chew 1 tablet (81 mg total) by mouth 2 (two) times daily. Take for 4 weeks, then resume regular dose. 10/31/17 11/30/17  Lanney Gins, PA-C  buPROPion (WELLBUTRIN XL) 150 MG 24 hr tablet Take 150 mg by mouth daily. 10/13/17   [provider]  docusate sodium (COLACE) 100 MG capsule Take 1 capsule (100 mg total) by mouth 2 (two) times daily. 10/30/17   Lanney Gins, PA-C  ferrous sulfate (FERROUSUL) 325 (65 FE) MG tablet Take 1 tablet (325 mg total) by mouth 3 (three) times daily with meals. 10/30/17   Lanney Gins, PA-C  HYDROcodone-acetaminophen (NORCO) 7.5-325 MG tablet Take 1-2 tablets by mouth every 4 (four) hours as needed for moderate pain. 10/30/17   Lanney Gins, PA-C  levothyroxine (SYNTHROID, LEVOTHROID) 50 MCG tablet Take 50 mcg by mouth daily before breakfast.    [provider]  lisinopril (PRINIVIL,ZESTRIL) 10 MG tablet Take 10 mg by mouth daily. 10/13/17   [provider]  methocarbamol (ROBAXIN) 500 MG tablet Take 1 tablet (500 mg  total) by mouth every 6 (six) hours as needed for muscle spasms. 10/30/17   Lanney Gins, PA-C  polyethylene glycol (MIRALAX / GLYCOLAX) packet Take 17 g by mouth 2 (two) times daily. 10/30/17   Lanney Gins, PA-C  sertraline (ZOLOFT) 100 MG tablet Take 100 mg by mouth daily.  07/13/16   [provider]  TURMERIC PO Take 2 tablets by mouth every morning.    [provider]    Family History History reviewed. No pertinent family history.  Social History Social History   Tobacco Use  . Smoking status: Former Smoker    Types: Cigarettes    Last attempt to quit: 03/20/1984    Years since quitting: 33.6  . Smokeless  tobacco: Never Used  Substance Use Topics  . Alcohol use: Yes    Comment: occassionally  . Drug use: Not Currently     Allergies   Patient has no known allergies.   Review of Systems Review of Systems  Constitutional: Positive for fever.  All other systems reviewed and are negative.    Physical Exam Updated Vital Signs BP 138/83 (BP Location: Left Arm)   Pulse (!) 115   Temp 99.1 F (37.3 C) (Oral)   Resp 18   SpO2 98%   Physical Exam  Constitutional: He is oriented to person, place, and time. He appears well-developed and well-nourished.  HENT:  Head: Normocephalic and atraumatic.  Mouth/Throat: Oropharynx is clear and moist.  Eyes: Pupils are equal, round, and reactive to light. Conjunctivae and EOM are normal.  Neck: Normal range of motion.  Cardiovascular: Normal rate, regular rhythm and normal heart sounds.  Pulmonary/Chest: Effort normal and breath sounds normal. No stridor. No respiratory distress.  Abdominal: Soft. Bowel sounds are normal.  Musculoskeletal: Normal range of motion.  Left leg with erythema extending from beneath surgical dressing extending down the lateral thigh and somewhat to the anterior thigh; leg is very warm to the touch; no tissue crepitus; no drainage or bleeding  Neurological: He is alert and oriented to person, place, and time.  Skin: Skin is warm and dry.  Psychiatric: He has a normal mood and affect.  Nursing note and vitals reviewed.       ED Treatments / Results  Labs (all labs ordered are listed, but only abnormal results are displayed) Labs Reviewed  COMPREHENSIVE METABOLIC PANEL - Abnormal; Notable for the following components:      Result Value   Glucose, Bld 114 (*)    All other components within normal limits  CBC WITH DIFFERENTIAL/PLATELET - Abnormal; Notable for the following components:   RBC 3.56 (*)    Hemoglobin 11.3 (*)    HCT 33.4 (*)    All other components within normal limits  URINALYSIS, ROUTINE W  REFLEX MICROSCOPIC - Abnormal; Notable for the following components:   Hgb urine dipstick SMALL (*)    All other components within normal limits  CULTURE, BLOOD (ROUTINE X 2)  CULTURE, BLOOD (ROUTINE X 2)  I-STAT CG4 LACTIC ACID, ED  I-STAT CG4 LACTIC ACID, ED    EKG None  Radiology Dg Chest 2 View  Result Date: 11/02/2017 CLINICAL DATA:  Status post recent LEFT hip arthroplasty.  Fever. EXAM: CHEST - 2 VIEW COMPARISON:  None. FINDINGS: Cardiac silhouette is upper limits of normal size, mediastinal silhouette is not suspicious. No pleural effusions or focal consolidations. Trachea projects midline and there is no pneumothorax. Soft tissue planes and included osseous structures are non-suspicious. IMPRESSION: Borderline cardiomegaly.  No  acute pulmonary process. Electronically Signed   By: Awilda Metro M.D.   On: 11/02/2017 23:57   Dg Hip Unilat W Or W/o Pelvis 2-3 Views Left  Result Date: 11/02/2017 CLINICAL DATA:  Fever.  Status post LEFT hip total arthroplasty. EXAM: DG HIP (WITH OR WITHOUT PELVIS) 2-3V LEFT COMPARISON:  Pelvic radiograph October 30, 2017 FINDINGS: Status post LEFT hip total arthroplasty, stable appearance of the hardware. No periprosthetic lucency. No fracture for dislocation. No destructive bony lesions. Residual LEFT hip subcutaneous gas consistent with recent surgery. IMPRESSION: 1. No acute fracture deformity dislocation. 2. Status post recent LEFT total hip arthroplasty. Electronically Signed   By: Awilda Metro M.D.   On: 11/02/2017 23:56    Procedures Procedures (including critical care time)  Medications Ordered in ED Medications  oxyCODONE-acetaminophen (PERCOCET/ROXICET) 5-325 MG per tablet 1 tablet (has no administration in time range)  fentaNYL (SUBLIMAZE) injection 50 mcg (50 mcg Intravenous Given 11/02/17 2305)  doxycycline (VIBRA-TABS) tablet 100 mg (100 mg Oral Given 11/03/17 0058)     Initial Impression / Assessment and Plan / ED Course    I have reviewed the triage vital signs and the nursing notes.  Pertinent labs & imaging results that were available during my care of the patient were reviewed by me and considered in my medical decision making (see chart for details).  57 y.o. M here with fever that started today, TMax 101F.  Temp 79F on arrival here, non-toxic in appearance.  Does have some apparent erythema stemming from under his surgical dressing extending down lateral thigh and somewhat into anterior thigh.  No extension into the groin.  No tissue crepitus or notable drainage.  Labs pending.  Will send blood cultures, x-ray hip and chest.  Labs overall reassuring.  Normal lactate and WBC count.  UA without signs of infection.  CXR and hip films without acute findings.  Blood cultures pending.  Will discuss with orthopedics for recommendations.  12:16 AM Discussed with Dr. Darrelyn Hillock, on call for [facility]-- recommends to start doxycycline 100mg  twice daily, check temperature twice daily over the weekend and call ortho on call provider if temp over 101F.  Needs to be seen Monday in clinic regardless of temperature changes over the weekend.  Plan discussed with patient and his wife, they are comfortable with plan of care and understand importance of close follow-up.  Given first dose doxycycline here.  They will return here for any new or acute changes.  Final Clinical Impressions(s) / ED Diagnoses   Final diagnoses:  Fever  Skin erythema    ED Discharge Orders         Ordered    doxycycline (VIBRAMYCIN) 100 MG capsule  2 times daily     11/03/17 0102           Garlon Hatchet, PA-C 11/03/17 0104    Gerhard Munch, MD 11/05/17 [phone]

## 2017-11-03 MED ORDER — DOXYCYCLINE HYCLATE 100 MG PO CAPS: 100.0000 mg | ORAL_CAPSULE | Freq: Two times a day (BID) | ORAL | 0 refills | Status: DC

## 2017-11-03 MED ORDER — OXYCODONE-ACETAMINOPHEN 5-325 MG PO TABS
1.0000 | ORAL_TABLET | Freq: Once | ORAL | Status: AC
  Administered 2017-11-03: 1 via ORAL
  Filled 2017-11-03: qty 1

## 2017-11-03 MED ORDER — DOXYCYCLINE HYCLATE 100 MG PO TABS
100.0000 mg | ORAL_TABLET | Freq: Once | ORAL | Status: AC
  Administered 2017-11-03: 100 mg via ORAL
  Filled 2017-11-03: qty 1

## 2017-11-03 NOTE — Discharge Instructions (Signed)
Take the prescribed medication as directed. Check temperature twice daily over the weekend-- any temp over 101F, call the on call doctor at the orthopedic practice for further instructions. Follow-up in the clinic Monday at 9am. Return to the ED for new or worsening symptoms.

## 2017-11-08 LAB — CULTURE, BLOOD (ROUTINE X 2)
CULTURE: NO GROWTH
Culture: NO GROWTH
Special Requests: ADEQUATE

## 2017-11-11 NOTE — Discharge Summary (Signed)
Physician Discharge Summary  Patient ID: Ryan Hancock MRN: [id number] DOB/AGE: 58-Sep-1962 58 y.o.  Admit date: 10/30/2017 Discharge date: 10/31/2017  2Procedures:  Procedure(s) (LRB): LEFT TOTAL HIP ARTHROPLASTY ANTERIOR APPROACH (Left)  Attending Physician:  Dr. Durene Romans   Admission Diagnoses:   Left hip primary OA / pain  Discharge Diagnoses:  Principal Problem:   S/P left THA, AA Active Problems:   Obese   S/P hip replacement  Past Medical History:  Diagnosis Date  . Anxiety   . Arthritis   . Depression   . Hypertension   . Hypothyroidism     HPI:    Ryan Hancock, 57 y.o. male, has a history of pain and functional disability in the left hip(s) due to arthritis and patient has failed non-surgical conservative treatments for greater than 12 weeks to include NSAID's and/or analgesics, corticosteriod injections and activity modification.  Onset of symptoms was gradual starting 8+ years ago with gradually worsening course since that time.The patient noted no past surgery on the left hip(s).  Patient currently rates pain in the left hip at 7 out of 10 with activity. Patient has night pain, worsening of pain with activity and weight bearing, trendelenberg gait, pain that interfers with activities of daily living and pain with passive range of motion. Patient has evidence of periarticular osteophytes and joint space narrowing by imaging studies. This condition presents safety issues increasing the risk of falls.  There is no current active infection.  Risks, benefits and expectations were discussed with the patient.  Risks including but not limited to the risk of anesthesia, blood clots, nerve damage, blood vessel damage, failure of the prosthesis, infection and up to and including death.  Patient understand the risks, benefits and expectations and wishes to proceed with surgery.   PCP: Dois Davenport, MD   Discharged Condition: good  Hospital Course:  Patient underwent the above  stated procedure on 10/30/2017. Patient tolerated the procedure well and brought to the recovery room in good condition and subsequently to the floor.  POD #1 BP: 111/72 ; Pulse: 80 ; Temp: 98.2 F (36.8 C) ; Resp: 16 Patient reports pain as mild, pain controlled.  States that he has taken minimal pain meds and describes his hip as more sore than pain.  No reported events throughout the night. Ready to be discharged home.  Dorsiflexion/plantar flexion intact, incision: dressing C/D/I, no cellulitis present and compartment soft.   LABS  Basename    HGB     11.6  HCT     34.5    Discharge Exam: General appearance: alert, cooperative and no distress Extremities: Homans sign is negative, no sign of DVT, no edema, redness or tenderness in the calves or thighs and no ulcers, gangrene or trophic changes  Disposition:  Home with follow up in 2 weeks   Follow-up Information    Durene Romans, MD. Schedule an appointment as soon as possible for a visit in 2 weeks.   Specialty:  Orthopedic Surgery Contact information: [address] [place] [address] [phone]           Discharge Instructions    Call MD / Call 911   Complete by:  As directed    If you experience chest pain or shortness of breath, CALL 911 and be transported to the hospital emergency room.  If you develope a fever above 101 F, pus (white drainage) or increased drainage or redness at the wound, or calf pain, call your surgeon's  office.   Change dressing   Complete by:  As directed    Maintain surgical dressing until follow up in the clinic. If the edges start to pull up, may reinforce with tape. If the dressing is no longer working, may remove and cover with gauze and tape, but must keep the area dry and clean.  Call with any questions or concerns.   Constipation Prevention   Complete by:  As directed    Drink plenty of fluids.  Prune juice may be helpful.  You may use a stool softener, such as Colace  (over the counter) 100 mg twice a day.  Use MiraLax (over the counter) for constipation as needed.   Diet - low sodium heart healthy   Complete by:  As directed    Discharge instructions   Complete by:  As directed    Maintain surgical dressing until follow up in the clinic. If the edges start to pull up, may reinforce with tape. If the dressing is no longer working, may remove and cover with gauze and tape, but must keep the area dry and clean.  Follow [facility] in 2 weeks at [facility]. Call with any questions or concerns.   Increase activity slowly as tolerated   Complete by:  As directed    Weight bearing as tolerated with assist device (walker, cane, etc) as directed, use it as long as suggested by your surgeon or therapist, typically at least 4-6 weeks.   TED hose   Complete by:  As directed    Use stockings (TED hose) for 2 weeks on both leg(s).  You may remove them at night for sleeping.      Allergies as of 10/31/2017   No Known Allergies     Medication List    STOP taking these medications   acetaminophen 500 MG tablet Commonly known as:  TYLENOL   ibuprofen 200 MG tablet Commonly known as:  ADVIL,MOTRIN     TAKE these medications   aspirin 81 MG chewable tablet Chew 1 tablet (81 mg total) by mouth 2 (two) times daily. Take for 4 weeks, then resume regular dose.   buPROPion 150 MG 24 hr tablet Commonly known as:  WELLBUTRIN XL Take 150 mg by mouth daily.   docusate sodium 100 MG capsule Commonly known as:  COLACE Take 1 capsule (100 mg total) by mouth 2 (two) times daily.   ferrous sulfate 325 (65 FE) MG tablet Take 1 tablet (325 mg total) by mouth 3 (three) times daily with meals.   HYDROcodone-acetaminophen 7.5-325 MG tablet Commonly known as:  NORCO Take 1-2 tablets by mouth every 4 (four) hours as needed for moderate pain.   levothyroxine 50 MCG tablet Commonly known as:  SYNTHROID, LEVOTHROID Take 50 mcg by mouth daily before breakfast.     lisinopril 10 MG tablet Commonly known as:  PRINIVIL,ZESTRIL Take 10 mg by mouth daily.   methocarbamol 500 MG tablet Commonly known as:  ROBAXIN Take 1 tablet (500 mg total) by mouth every 6 (six) hours as needed for muscle spasms.   polyethylene glycol packet Commonly known as:  MIRALAX / GLYCOLAX Take 17 g by mouth 2 (two) times daily.   sertraline 100 MG tablet Commonly known as:  ZOLOFT Take 100 mg by mouth daily.   TURMERIC PO Take 2 tablets by mouth every morning.            Discharge Care Instructions  (From admission, onward)  Start     Ordered   10/31/17 0000  Change dressing    Comments:  Maintain surgical dressing until follow up in the clinic. If the edges start to pull up, may reinforce with tape. If the dressing is no longer working, may remove and cover with gauze and tape, but must keep the area dry and clean.  Call with any questions or concerns.   10/31/17 0759           Signed: Anastasio Auerbach. Dio Giller   PA-C  11/11/2017, 3:45 PM

## 2018-09-19 DIAGNOSIS — I10 Essential (primary) hypertension: Secondary | ICD-10-CM | POA: Diagnosis not present

## 2018-09-19 DIAGNOSIS — Z125 Encounter for screening for malignant neoplasm of prostate: Secondary | ICD-10-CM | POA: Diagnosis not present

## 2018-09-19 DIAGNOSIS — E039 Hypothyroidism, unspecified: Secondary | ICD-10-CM | POA: Diagnosis not present

## 2018-09-19 DIAGNOSIS — Z0000 Encounter for general adult medical examination without abnormal findings: Secondary | ICD-10-CM | POA: Diagnosis not present

## 2018-09-19 DIAGNOSIS — E781 Pure hyperglyceridemia: Secondary | ICD-10-CM | POA: Diagnosis not present

## 2018-10-10 DIAGNOSIS — E119 Type 2 diabetes mellitus without complications: Secondary | ICD-10-CM | POA: Diagnosis not present

## 2018-10-31 DIAGNOSIS — E119 Type 2 diabetes mellitus without complications: Secondary | ICD-10-CM | POA: Diagnosis not present

## 2019-01-02 ENCOUNTER — Other Ambulatory Visit: Payer: Self-pay

## 2019-01-02 ENCOUNTER — Encounter (HOSPITAL_COMMUNITY): Payer: Self-pay | Admitting: Obstetrics and Gynecology

## 2019-01-02 ENCOUNTER — Emergency Department (HOSPITAL_COMMUNITY): Payer: BC Managed Care – PPO

## 2019-01-02 ENCOUNTER — Emergency Department (HOSPITAL_COMMUNITY)
Admission: EM | Admit: 2019-01-02 | Discharge: 2019-01-02 | Disposition: A | Discharge status: Home / Self Care | Payer: BC Managed Care – PPO | Attending: Emergency Medicine | Admitting: Emergency Medicine

## 2019-01-02 DIAGNOSIS — I10 Essential (primary) hypertension: Secondary | ICD-10-CM | POA: Insufficient documentation

## 2019-01-02 DIAGNOSIS — K611 Rectal abscess: Secondary | ICD-10-CM | POA: Diagnosis not present

## 2019-01-02 DIAGNOSIS — E039 Hypothyroidism, unspecified: Secondary | ICD-10-CM | POA: Insufficient documentation

## 2019-01-02 DIAGNOSIS — K6289 Other specified diseases of anus and rectum: Secondary | ICD-10-CM | POA: Diagnosis not present

## 2019-01-02 DIAGNOSIS — L0231 Cutaneous abscess of buttock: Secondary | ICD-10-CM

## 2019-01-02 DIAGNOSIS — Z79899 Other long term (current) drug therapy: Secondary | ICD-10-CM | POA: Insufficient documentation

## 2019-01-02 DIAGNOSIS — K610 Anal abscess: Secondary | ICD-10-CM | POA: Diagnosis not present

## 2019-01-02 DIAGNOSIS — Z87891 Personal history of nicotine dependence: Secondary | ICD-10-CM | POA: Insufficient documentation

## 2019-01-02 LAB — CBC WITH DIFFERENTIAL/PLATELET
Abs Immature Granulocytes: 0.02 10*3/uL (ref 0.00–0.07)
Basophils Absolute: 0.1 10*3/uL (ref 0.0–0.1)
Basophils Relative: 1 %
Eosinophils Absolute: 0.2 10*3/uL (ref 0.0–0.5)
Eosinophils Relative: 2 %
HCT: 40.4 % (ref 39.0–52.0)
Hemoglobin: 13.1 g/dL (ref 13.0–17.0)
Immature Granulocytes: 0 %
Lymphocytes Relative: 12 %
Lymphs Abs: 1.4 10*3/uL (ref 0.7–4.0)
MCH: 31.3 pg (ref 26.0–34.0)
MCHC: 32.4 g/dL (ref 30.0–36.0)
MCV: 96.4 fL (ref 80.0–100.0)
Monocytes Absolute: 0.9 10*3/uL (ref 0.1–1.0)
Monocytes Relative: 9 %
Neutro Abs: 8.5 10*3/uL — ABNORMAL HIGH (ref 1.7–7.7)
Neutrophils Relative %: 76 %
Platelets: 319 10*3/uL (ref 150–400)
RBC: 4.19 MIL/uL — ABNORMAL LOW (ref 4.22–5.81)
RDW: 12.3 % (ref 11.5–15.5)
WBC: 11.1 10*3/uL — ABNORMAL HIGH (ref 4.0–10.5)
nRBC: 0 % (ref 0.0–0.2)

## 2019-01-02 LAB — COMPREHENSIVE METABOLIC PANEL
ALT: 31 U/L (ref 0–44)
AST: 19 U/L (ref 15–41)
Albumin: 3.4 g/dL — ABNORMAL LOW (ref 3.5–5.0)
Alkaline Phosphatase: 66 U/L (ref 38–126)
Anion gap: 9 (ref 5–15)
BUN: 13 mg/dL (ref 6–20)
CO2: 26 mmol/L (ref 22–32)
Calcium: 8.8 mg/dL — ABNORMAL LOW (ref 8.9–10.3)
Chloride: 103 mmol/L (ref 98–111)
Creatinine, Ser: 0.87 mg/dL (ref 0.61–1.24)
GFR calc Af Amer: >60 mL/min (ref >60)
GFR calc non Af Amer: >60 mL/min (ref >60)
Glucose, Bld: 118 mg/dL — ABNORMAL HIGH (ref 70–99)
Potassium: 4 mmol/L (ref 3.5–5.1)
Sodium: 138 mmol/L (ref 135–145)
Total Bilirubin: 0.6 mg/dL (ref 0.3–1.2)
Total Protein: 6.7 g/dL (ref 6.5–8.1)

## 2019-01-02 LAB — LACTIC ACID, PLASMA: Lactic Acid, Venous: 0.9 mmol/L (ref 0.5–1.9)

## 2019-01-02 MED ORDER — SODIUM CHLORIDE (PF) 0.9 % IJ SOLN
INTRAMUSCULAR | Status: AC
  Filled 2019-01-02: qty 50

## 2019-01-02 MED ORDER — IOHEXOL 300 MG/ML  SOLN
100.0000 mL | Freq: Once | INTRAMUSCULAR | Status: AC | PRN
  Administered 2019-01-02: 100 mL via INTRAVENOUS

## 2019-01-02 MED ORDER — LIDOCAINE-EPINEPHRINE (PF) 2 %-1:200000 IJ SOLN
20.0000 mL | Freq: Once | INTRAMUSCULAR | Status: AC
  Administered 2019-01-02: 20 mL
  Filled 2019-01-02: qty 20

## 2019-01-02 MED ORDER — CLINDAMYCIN PHOSPHATE 600 MG/50ML IV SOLN
600.0000 mg | Freq: Once | INTRAVENOUS | Status: AC
  Administered 2019-01-02: 600 mg via INTRAVENOUS
  Filled 2019-01-02: qty 50

## 2019-01-02 MED ORDER — SULFAMETHOXAZOLE-TRIMETHOPRIM 800-160 MG PO TABS: 1.0000 | ORAL_TABLET | Freq: Two times a day (BID) | ORAL | 0 refills | Status: AC

## 2019-01-02 NOTE — ED Notes (Signed)
Phlebotomy called for second set of blood cultures

## 2019-01-02 NOTE — ED Notes (Signed)
Failed attempt to collect BC. RN notified.  

## 2019-01-02 NOTE — ED Provider Notes (Signed)
[facility]-EMERGENCY DEPT Provider Note   CSN: [id number] Arrival date & time: 01/02/19  0708     History   Chief Complaint Chief Complaint  Patient presents with   Abscess    HPI Ryan Hancock is a 58 y.o. male.     Patient is a 58 year old gentleman with past medical history of anxiety, depression, hypertension, hypothyroidism presenting to the emergency department for rectal pain.  Patient reports that about a week ago he began to feel pain in his rectum and buttock.  Reports that he has history of hemorrhoids and he had also went on a long car ride so he thought that this may have been the cause.  Reports that the pain got worse instead of the swelling.  Denies any drainage or bleeding.  Reports that there is a significant amount of pain with trying to have a bowel movement.  Also reports that it started to feel like it was harder for him to urinate as well.  Denies any fever, chills, nausea, vomiting, abdominal pain.  Has not tried anything for relief.     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 Authorizing Provider  acetaminophen (TYLENOL) 500 MG tablet Take 500 mg by mouth every 6 (six) hours as needed for moderate pain.   Yes [provider]  buPROPion (WELLBUTRIN XL) 150 MG 24 hr tablet  Take 150 mg by mouth daily. 10/13/17  Yes [provider]  levothyroxine (SYNTHROID, LEVOTHROID) 50 MCG tablet Take 50 mcg by mouth daily before breakfast.   Yes [provider]  lisinopril (PRINIVIL,ZESTRIL) 10 MG tablet Take 10 mg by mouth daily. 10/13/17  Yes [provider]  sertraline (ZOLOFT) 100 MG tablet Take 100 mg by mouth daily.  07/13/16  Yes [provider]  TURMERIC PO Take 2 tablets by mouth every morning.   Yes [provider]  docusate sodium (COLACE) 100 MG capsule Take 1 capsule (100 mg total) by mouth 2 (two) times daily. Patient not taking: Reported on 01/02/2019 10/30/17   Lanney Gins, PA-C  doxycycline (VIBRAMYCIN) 100 MG capsule Take 1 capsule (100 mg total) by mouth 2 (two) times daily. Patient not taking: Reported on 01/02/2019 11/03/17   Garlon Hatchet, PA-C  ferrous sulfate (FERROUSUL) 325 (65 FE) MG tablet Take 1 tablet (325 mg total) by mouth 3 (three) times daily with meals. Patient not taking: Reported on 01/02/2019 10/30/17   Lanney Gins, PA-C  HYDROcodone-acetaminophen (NORCO) 7.5-325 MG tablet Take 1-2 tablets by mouth every 4 (four) hours as needed for moderate pain. Patient not taking: Reported on 01/02/2019 10/30/17   Lanney Gins, PA-C  methocarbamol (ROBAXIN) 500 MG tablet Take 1 tablet (500 mg total)  by mouth every 6 (six) hours as needed for muscle spasms. Patient not taking: Reported on 01/02/2019 10/30/17   Lanney GinsBabish, Matthew, PA-C  polyethylene glycol (MIRALAX / Ethelene HalGLYCOLAX) packet Take 17 g by mouth 2 (two) times daily. Patient not taking: Reported on 01/02/2019 10/30/17   Lanney GinsBabish, Matthew, PA-C  sulfamethoxazole-trimethoprim (BACTRIM DS) 800-160 MG tablet Take 1 tablet by mouth 2 (two) times daily for 10 days. 01/02/19 01/12/19  Arlyn DunningMcLean, Donnae Michels A, PA-C    Family History No family history on file.  Social History Social History   Tobacco Use   Smoking status: Former Smoker    Types: Cigarettes    Quit date:  03/20/1984    Years since quitting: 34.8   Smokeless tobacco: Never Used  Substance Use Topics   Alcohol use: Yes    Comment: occassionally   Drug use: Not Currently     Allergies   Patient has no known allergies.   Review of Systems Review of Systems  Gastrointestinal: Positive for constipation and rectal pain. Negative for abdominal distention, abdominal pain, anal bleeding, blood in stool, diarrhea, nausea and vomiting.  Genitourinary: Positive for decreased urine volume. Negative for discharge, dysuria, flank pain, hematuria, penile pain, penile swelling, scrotal swelling, testicular pain and urgency.  Musculoskeletal: Negative for back pain.  Allergic/Immunologic: Negative for immunocompromised state.  Neurological: Negative for dizziness.  Hematological: Does not bruise/bleed easily.  All other systems reviewed and are negative.    Physical Exam Updated Vital Signs BP (!) 149/80    Pulse 97    Temp 98.5 F (36.9 C) (Oral)    Resp 16    SpO2 99%   Physical Exam Vitals signs and nursing note reviewed. Exam conducted with a chaperone present.  Constitutional:      Appearance: Normal appearance.  HENT:     Head: Normocephalic.  Eyes:     Conjunctiva/sclera: Conjunctivae normal.  Cardiovascular:     Rate and Rhythm: Normal rate and regular rhythm.  Pulmonary:     Effort: Pulmonary effort is normal.  Genitourinary:    Pubic Area: No rash.      Scrotum/Testes:        Right: Tenderness or swelling not present.        Left: Tenderness or swelling not present.    Skin:    General: Skin is dry.  Neurological:     Mental Status: He is alert.  Psychiatric:        Mood and Affect: Mood normal.      ED Treatments / Results  Labs (all labs ordered are listed, but only abnormal results are displayed) Labs Reviewed  CBC WITH DIFFERENTIAL/PLATELET - Abnormal; Notable for the following components:      Result Value   WBC 11.1 (*)    RBC 4.19 (*)    Neutro Abs  8.5 (*)    All other components within normal limits  COMPREHENSIVE METABOLIC PANEL - Abnormal; Notable for the following components:   Glucose, Bld 118 (*)    Calcium 8.8 (*)    Albumin 3.4 (*)    All other components within normal limits  CULTURE, BLOOD (ROUTINE X 2)  CULTURE, BLOOD (ROUTINE X 2)  LACTIC ACID, PLASMA    EKG None  Radiology Ct Pelvis W Contrast  Result Date: 01/02/2019 CLINICAL DATA:  Anorectal abscess EXAM: CT PELVIS WITH CONTRAST TECHNIQUE: Multidetector CT imaging of the pelvis was performed using the standard protocol following the bolus administration of intravenous contrast. CONTRAST:  100mL OMNIPAQUE IOHEXOL 300  MG/ML  SOLN COMPARISON:  None. FINDINGS: Urinary Tract: Unremarkable where visualized. Portions of the left side of the urinary bladder are obscured by streak artifact from patient's left hip prosthesis. Bowel: The visualized bowel appears unremarkable. However, in the subcutaneous tissues of the right posteromedial buttock, and outside of the external sphincter, a 8.6 by 4.9 by 5.3 cm (volume = 120 cm^3) fluid collection is present suspicious for a perianal abscess extending in the subcutaneous tissues. Fistulous connection to the anal region is not readily excluded on today's CT, but this process appears to be external to the external sphincter. Asymmetric edema in the right lower buttock region favoring cellulitis. No gas tracking in the soft tissues. The perineum is partially involved in the vicinity of the presumed abscess. Vascular/Lymphatic: Mild atherosclerotic calcification of the common iliac arteries. No pathologic adenopathy. Reproductive: Dystrophic calcifications centrally in the prostate gland. The prostate is mostly obscured by streak artifact from the left hip implant. Other:  No supplemental non-categorized findings. 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 No complicating feature related to the visualized portion of the left hip implant is identified. IMPRESSION: 1. 120 cubic cm subcutaneous abscess in the right posteromedial buttock. Fistulous connection to the anal region is not readily excluded on today's CT, but this process appears to be external to the external sphincter. 2. 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 3. Mild iliac artery atherosclerosis. Electronically Signed   By: Gaylyn Rong M.D.   On: 01/02/2019 12:15    Procedures .Marland KitchenIncision and Drainage  Date/Time: 01/02/2019 1:41 PM Performed by: Arlyn Dunning, PA-C Authorized by: Arlyn Dunning, PA-C   Consent:    Consent obtained:  Verbal   Consent given by:  Patient   Risks discussed:  Bleeding, incomplete drainage, pain and damage to other organs   Alternatives discussed:  No treatment and alternative treatment Universal protocol:    Procedure explained and questions answered to patient or proxy's satisfaction: yes     Relevant documents present and verified: yes     Test results available and properly labeled: yes     Imaging studies available: yes     Required blood products, implants, devices, and special equipment available: yes     Site/side marked: yes     Immediately prior to procedure a time out was called: yes     Patient identity confirmed:  Verbally with patient Location:    Type:  Abscess   Size:  120 cubic cm   Location:  Anogenital   Anogenital location: right buttock. Pre-procedure details:    Skin preparation:  Betadine Anesthesia (see MAR for exact dosages):    Anesthesia method:  Local infiltration   Local anesthetic:  Lidocaine 1% WITH epi Procedure type:    Complexity:  Complex Procedure details:    Needle aspiration: no     Incision types:  Single straight   Incision depth:   Subcutaneous   Scalpel blade:  11   Wound management:  Probed and deloculated, irrigated with saline and extensive cleaning   Drainage:  Purulent   Drainage amount:  Copious   Packing materials:  1/4 in gauze   Amount 1/4":  4 inches Post-procedure details:    Patient tolerance of procedure:  Tolerated well, no immediate complications   (  including critical care time)  Medications Ordered in ED Medications  sodium chloride (PF) 0.9 % injection (has no administration in time range)  clindamycin (CLEOCIN) IVPB 600 mg (0 mg Intravenous Stopped 01/02/19 1043)  iohexol (OMNIPAQUE) 300 MG/ML solution 100 mL (100 mLs Intravenous Contrast Given 01/02/19 1206)  lidocaine-EPINEPHrine (XYLOCAINE W/EPI) 2 %-1:200000 (PF) injection 20 mL (20 mLs Infiltration Given 01/02/19 1256)     Initial Impression / Assessment and Plan / ED Course  I have reviewed the triage vital signs and the nursing notes.  Pertinent labs & imaging results that were available during my care of the patient were reviewed by me and considered in my medical decision making (see chart for details).  Clinical Course as of Jan 01 1342  Thu Jan 02, 2019  1336 Patient presenting with abascess to buttock. Not septic, appears well. Large buttock abscess on CT but no tracking into the rectum or anus. Drained at bedside by myself, see procedure note. Patient tolerated well. Given IV clinda and sent home with rx for abx. Patient declined pain medication. Advised on strict return precautions and f/u PMD Monday.   [KM]    Clinical Course User Index [KM] Alveria Apley, PA-C       Based on review of vitals, medical screening exam, lab work and/or imaging, there does not appear to be an acute, emergent etiology for the patient's symptoms. Counseled pt on good return precautions and encouraged both PCP and ED follow-up as needed.  Prior to discharge, I also discussed incidental imaging findings with patient in detail and advised  appropriate, recommended follow-up in detail.  Clinical Impression: 1. Abscess of buttock, right     Disposition: Discharge  Prior to providing a prescription for a controlled substance, I independently reviewed the patient's recent prescription history on the [place]. The patient had no recent or regular prescriptions and was deemed appropriate for a brief, less than 3 day prescription of narcotic for acute analgesia.  This note was prepared with assistance of Systems analyst. Occasional wrong-word or sound-a-like substitutions may have occurred due to the inherent limitations of voice recognition software.   Final Clinical Impressions(s) / ED Diagnoses   Final diagnoses:  Abscess of buttock, right    ED Discharge Orders         Ordered    sulfamethoxazole-trimethoprim (BACTRIM DS) 800-160 MG tablet  2 times daily     01/02/19 1341           Kristine Royal 01/02/19 1343    Lajean Saver, MD 01/02/19 [phone]

## 2019-01-02 NOTE — ED Triage Notes (Signed)
Pt reports a rectal abscess that is "growing" Patient reports difficulty having a bowel movement. Patient reports last BM yesterday, but small compared to others.  Patient reports he has never had this happen before.

## 2019-01-02 NOTE — Discharge Instructions (Addendum)
You were seen for a large abscess on your right buttock.  This was drained in the emergency department.  We placed a small amount of packing.  You should keep the area clean and dry.  You can use baby wipes or warm soap and water.  The area may continue to drain a bloody or discolored material for the next couple of days.  We have drawn a line over your skin with a marker.  If there is redness that extends past this border or if you get a fever, or worsening pain please return to the emergency department.  The packing should be removed in 2 to 4 days.  It is okay if it falls out on its own.  We have given you a dose of IV antibiotics here in the emergency department and we have given you a prescription that you can fill either today or tomorrow to take. Thank you for allowing me to care for you today. Please return to the emergency department if you have new or worsening symptoms. Take your medications as instructed.

## 2019-01-02 NOTE — ED Notes (Signed)
Patient transported to CT 

## 2019-01-07 LAB — CULTURE, BLOOD (ROUTINE X 2)
Culture: NO GROWTH
Special Requests: ADEQUATE

## 2019-01-13 DIAGNOSIS — L989 Disorder of the skin and subcutaneous tissue, unspecified: Secondary | ICD-10-CM | POA: Diagnosis not present

## 2019-01-13 DIAGNOSIS — E119 Type 2 diabetes mellitus without complications: Secondary | ICD-10-CM | POA: Diagnosis not present

## 2019-01-13 DIAGNOSIS — E039 Hypothyroidism, unspecified: Secondary | ICD-10-CM | POA: Diagnosis not present

## 2019-05-08 ENCOUNTER — Ambulatory Visit: Payer: BC Managed Care – PPO | Attending: Internal Medicine

## 2019-05-08 DIAGNOSIS — Z23 Encounter for immunization: Secondary | ICD-10-CM

## 2019-05-08 NOTE — Progress Notes (Signed)
   Covid-19 Vaccination Clinic  Name:  Ryan Hancock    MRN: [id number] DOB: 09/24/1960  05/08/2019  Ryan Hancock was observed post Covid-19 immunization for 15 minutes without incident. He was provided with Vaccine Information Sheet and instruction to access the V-Safe system.   Ryan Hancock was instructed to call 911 with any severe reactions post vaccine: Marland Kitchen Difficulty breathing  . Swelling of face and throat  . A fast heartbeat  . A bad rash all over body  . Dizziness and weakness   Immunizations Administered    Name Date Dose VIS Date Route   Pfizer COVID-19 Vaccine 05/08/2019  4:34 PM 0.3 mL 01/17/2019 Intramuscular   Manufacturer: [employer], [employer]   Lot: [id number]   NDC: [address]-2

## 2019-06-04 ENCOUNTER — Ambulatory Visit: Payer: BC Managed Care – PPO

## 2019-06-11 ENCOUNTER — Ambulatory Visit: Payer: BC Managed Care – PPO | Attending: Internal Medicine

## 2019-06-11 DIAGNOSIS — Z23 Encounter for immunization: Secondary | ICD-10-CM

## 2019-06-11 NOTE — Progress Notes (Signed)
   Covid-19 Vaccination Clinic  Name:  Ryan Hancock    MRN: [id number] DOB: 04/26/1960  06/11/2019  Ryan Hancock was observed post Covid-19 immunization for 15 minutes without incident. He was provided with Vaccine Information Sheet and instruction to access the V-Safe system.   Ryan Hancock was instructed to call 911 with any severe reactions post vaccine: Marland Kitchen Difficulty breathing  . Swelling of face and throat  . A fast heartbeat  . A bad rash all over body  . Dizziness and weakness   Immunizations Administered    Name Date Dose VIS Date Route   Pfizer COVID-19 Vaccine 06/11/2019  4:49 PM 0.3 mL 04/02/2018 Intramuscular   Manufacturer: [employer], [employer]   Lot: [id number]   NDC: [address]-0

## 2019-07-10 DIAGNOSIS — E559 Vitamin D deficiency, unspecified: Secondary | ICD-10-CM | POA: Diagnosis not present

## 2019-07-11 DIAGNOSIS — I10 Essential (primary) hypertension: Secondary | ICD-10-CM | POA: Diagnosis not present

## 2019-07-11 DIAGNOSIS — E781 Pure hyperglyceridemia: Secondary | ICD-10-CM | POA: Diagnosis not present

## 2019-07-11 DIAGNOSIS — E039 Hypothyroidism, unspecified: Secondary | ICD-10-CM | POA: Diagnosis not present

## 2019-07-11 DIAGNOSIS — E119 Type 2 diabetes mellitus without complications: Secondary | ICD-10-CM | POA: Diagnosis not present

## 2019-07-31 DIAGNOSIS — Z713 Dietary counseling and surveillance: Secondary | ICD-10-CM | POA: Diagnosis not present

## 2019-08-14 DIAGNOSIS — Z713 Dietary counseling and surveillance: Secondary | ICD-10-CM | POA: Diagnosis not present

## 2019-08-28 DIAGNOSIS — Z713 Dietary counseling and surveillance: Secondary | ICD-10-CM | POA: Diagnosis not present

## 2019-09-11 DIAGNOSIS — Z713 Dietary counseling and surveillance: Secondary | ICD-10-CM | POA: Diagnosis not present

## 2019-09-25 DIAGNOSIS — Z713 Dietary counseling and surveillance: Secondary | ICD-10-CM | POA: Diagnosis not present

## 2019-10-16 DIAGNOSIS — Z713 Dietary counseling and surveillance: Secondary | ICD-10-CM | POA: Diagnosis not present

## 2019-10-30 DIAGNOSIS — Z713 Dietary counseling and surveillance: Secondary | ICD-10-CM | POA: Diagnosis not present

## 2019-11-13 DIAGNOSIS — Z713 Dietary counseling and surveillance: Secondary | ICD-10-CM | POA: Diagnosis not present

## 2019-11-26 DIAGNOSIS — Z713 Dietary counseling and surveillance: Secondary | ICD-10-CM | POA: Diagnosis not present

## 2019-12-29 DIAGNOSIS — G4733 Obstructive sleep apnea (adult) (pediatric): Secondary | ICD-10-CM | POA: Diagnosis not present

## 2019-12-29 DIAGNOSIS — F39 Unspecified mood [affective] disorder: Secondary | ICD-10-CM | POA: Diagnosis not present

## 2019-12-29 DIAGNOSIS — E039 Hypothyroidism, unspecified: Secondary | ICD-10-CM | POA: Diagnosis not present

## 2019-12-29 DIAGNOSIS — I10 Essential (primary) hypertension: Secondary | ICD-10-CM | POA: Diagnosis not present

## 2020-01-13 DIAGNOSIS — M79672 Pain in left foot: Secondary | ICD-10-CM | POA: Insufficient documentation

## 2020-02-17 IMAGING — CT CT PELVIS W/ CM
2 of 3 series · 15 of 46 positions shown, 17 images · IV contrast (omnipaque)
Comparison: None.

CLINICAL DATA: Anorectal abscess

EXAM:
CT PELVIS WITH CONTRAST
TECHNIQUE: Multidetector CT imaging of the pelvis was performed using the
standard protocol following the bolus administration of intravenous
contrast.
CONTRAST:  100mL OMNIPAQUE IOHEXOL 300 MG/ML  SOLN

[Series 2: axial st · axial · 0.96mm/px · z∈[+917,+1177]mm · 12 of 60 slices shown, 14 images]
[im 4/60  soft-tissue]
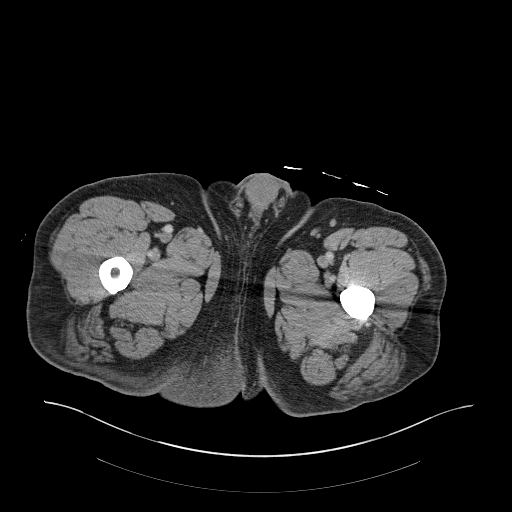
[im 4/60  bone]
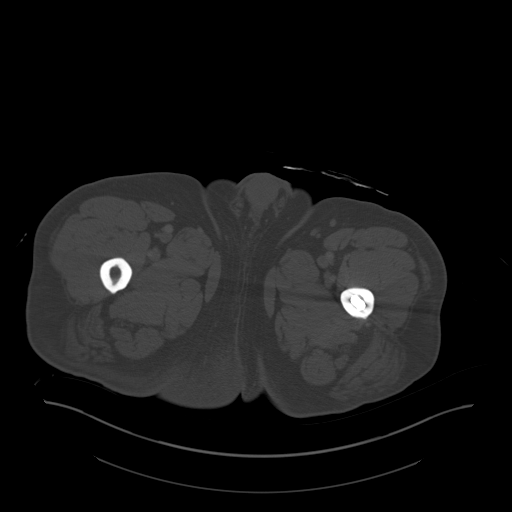
[im 8/60  soft-tissue]
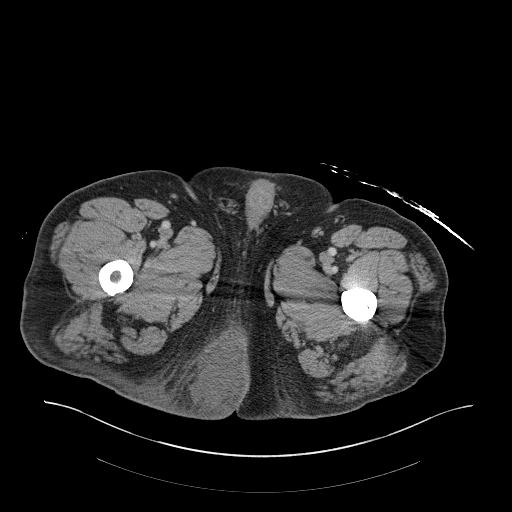
[im 14/60  soft-tissue]
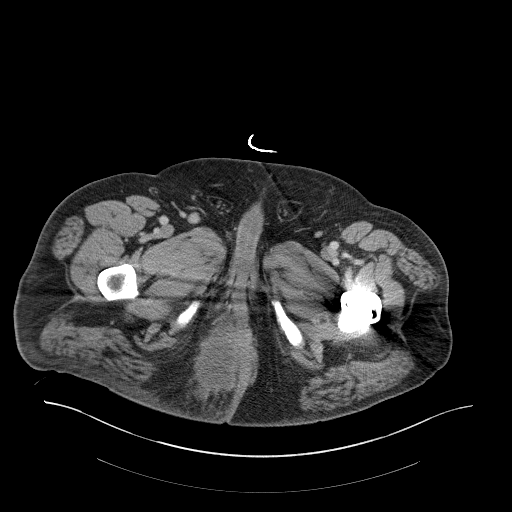
[im 18/60  soft-tissue]
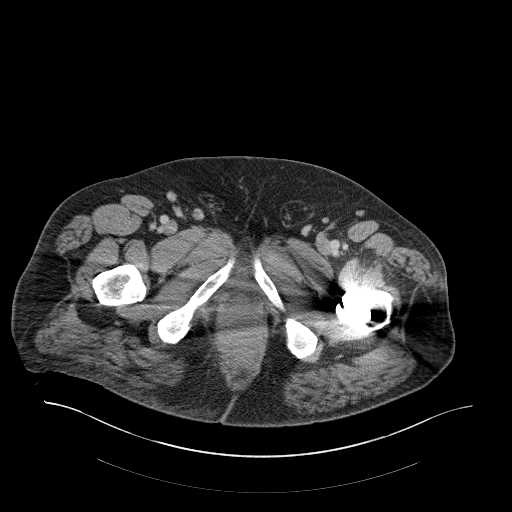
[im 23/60  soft-tissue]
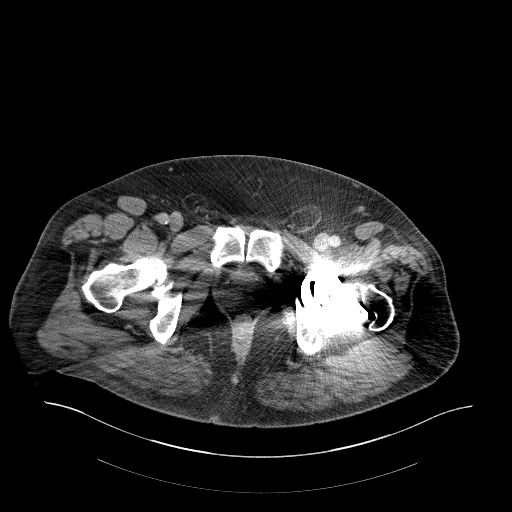
[im 27/60  soft-tissue]
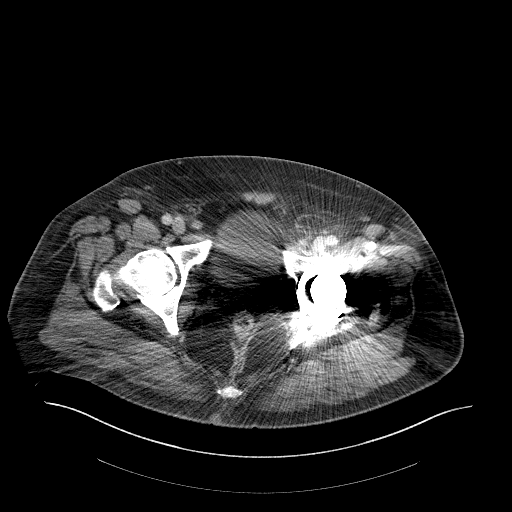
[im 33/60  soft-tissue]
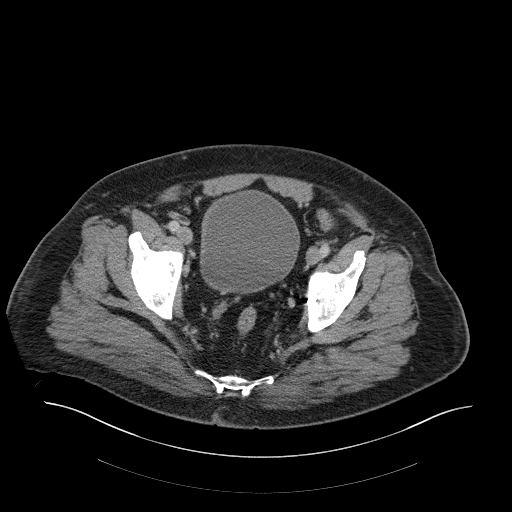
[im 37/60  soft-tissue]
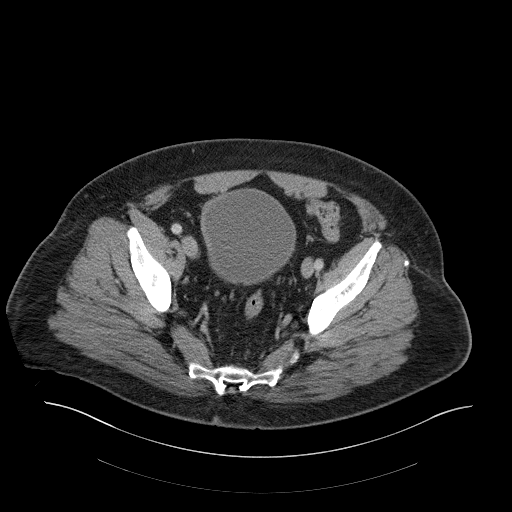
[im 42/60  soft-tissue]
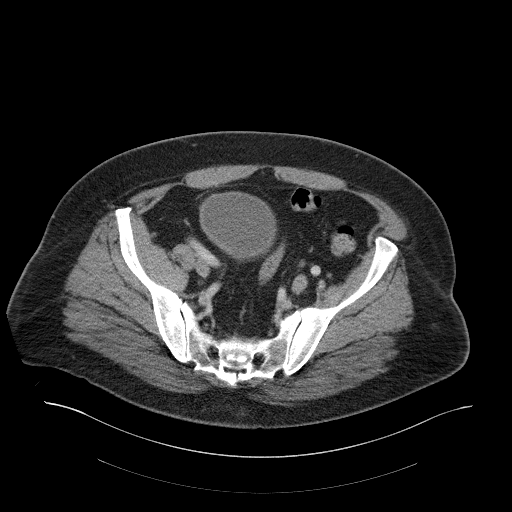
[im 42/60  bone]
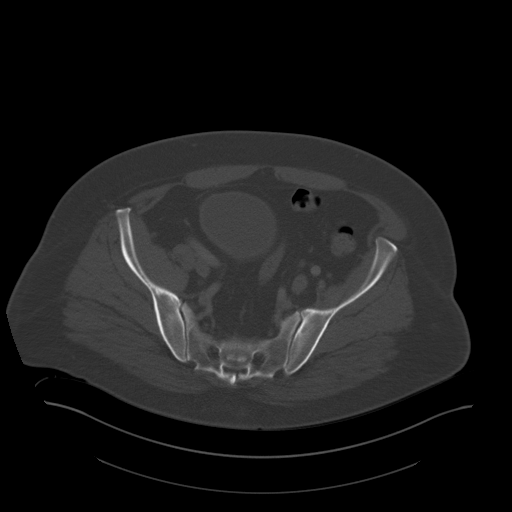
[im 46/60  soft-tissue]
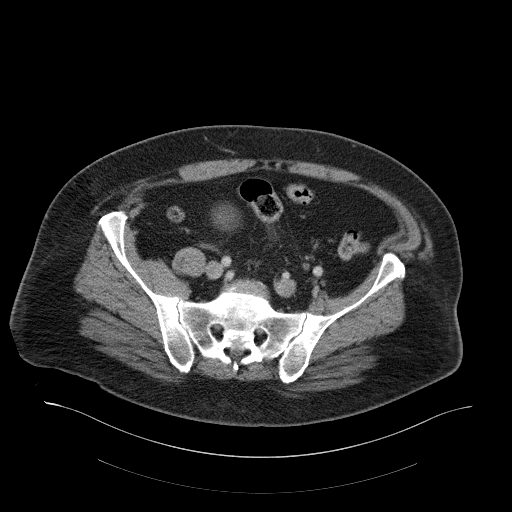
[im 52/60  soft-tissue]
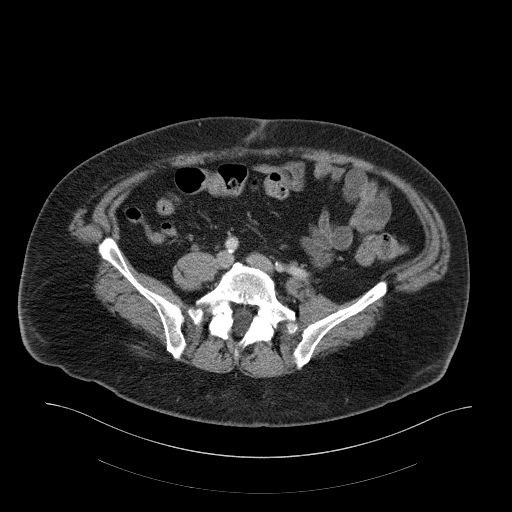
[im 56/60  soft-tissue]
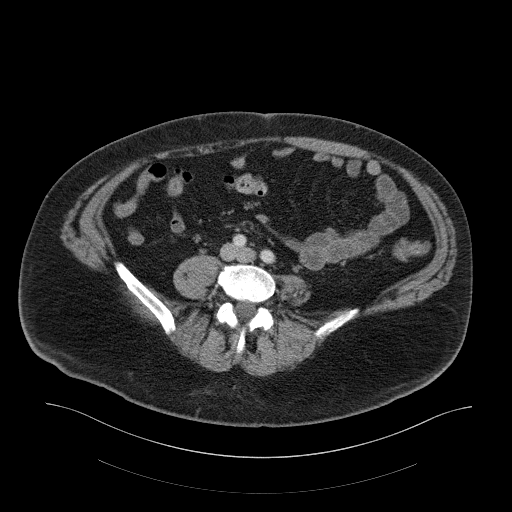

[Series 8: coronal st · coronal · 0.60mm/px · 3 of 167 slices shown]
[im 56/167  soft-tissue]
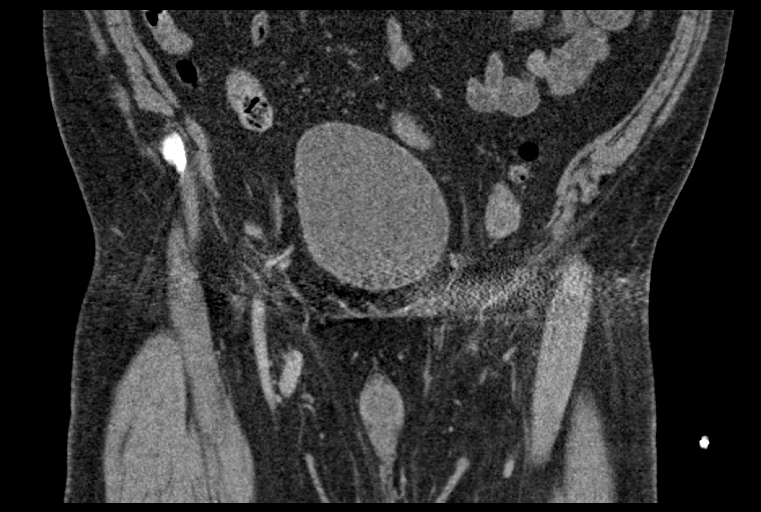
[im 74/167  soft-tissue]
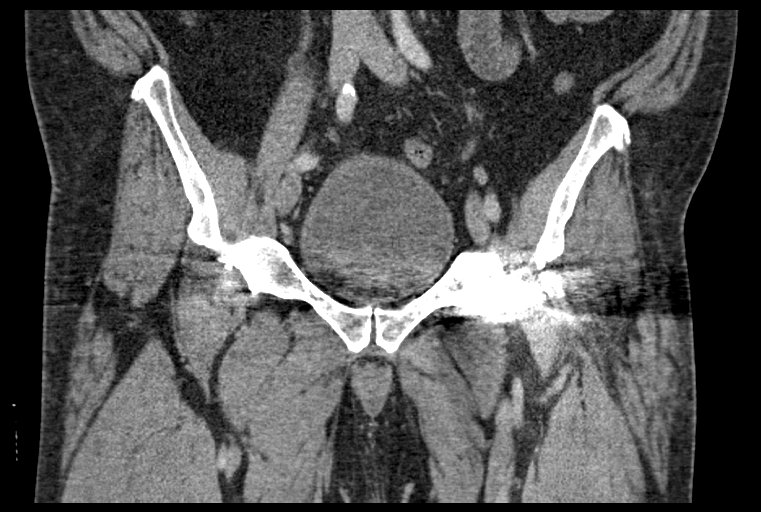
[im 93/167  soft-tissue]
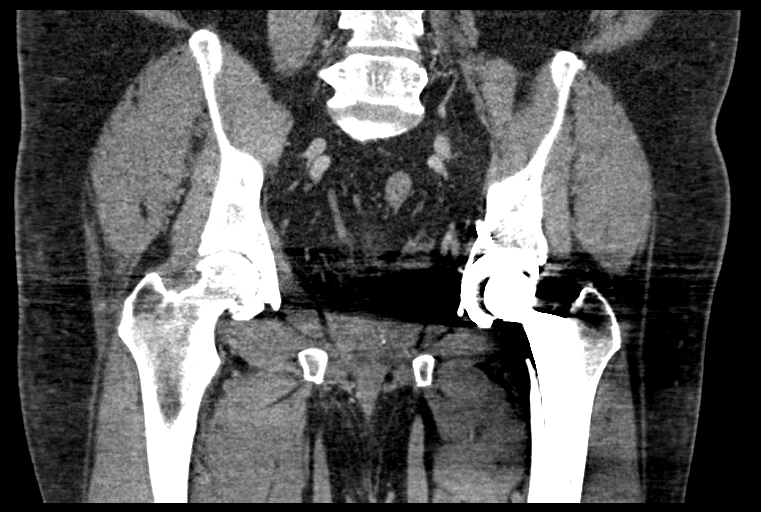

[15 of 46 positions shown; findings below may reference images not displayed]

FINDINGS: Urinary Tract: Unremarkable where visualized. Portions of the left
side of the urinary bladder are obscured by streak artifact from
patient's left hip prosthesis.

Bowel: The visualized bowel appears unremarkable. However, in the
subcutaneous tissues of the right posteromedial buttock, and outside
of the external sphincter, a 8.6 by 4.9 by 5.3 cm (volume = 120
cm^3) fluid collection is present suspicious for a perianal
abscess extending in the subcutaneous tissues. Fistulous connection
to the anal region is not readily excluded on today's CT, but this
process appears to be external to the external sphincter. Asymmetric
edema in the right lower buttock region favoring cellulitis. No gas
tracking in the soft tissues. The perineum is partially involved in
the vicinity of the presumed abscess.

Vascular/Lymphatic: Mild atherosclerotic calcification of the common
iliac arteries. No pathologic adenopathy.

Reproductive: Dystrophic calcifications centrally in the prostate
gland. The prostate is mostly obscured by streak artifact from the
left hip implant.

Other:  No supplemental non-categorized findings.

Musculoskeletal: Chronic bilateral pars defects at L5 with 3 mm
degenerative anterolisthesis of L5 on S1, and resulting left greater
than right foraminal impingement at L5-S1.

No complicating feature related to the visualized portion of the
left hip implant is identified.
IMPRESSION: 1. 120 cubic cm subcutaneous abscess in the right posteromedial
buttock. Fistulous connection to the anal region is not readily
excluded on today's CT, but this process appears to be external to
the external sphincter.
2. Chronic bilateral pars defects at L5 with 3 mm degenerative
anterolisthesis of L5 on S1 and resulting in left greater than right
foraminal impingement at L5-S1.
3. Mild iliac artery atherosclerosis.

## 2020-04-13 DIAGNOSIS — M79672 Pain in left foot: Secondary | ICD-10-CM | POA: Diagnosis not present

## 2020-04-13 DIAGNOSIS — M25562 Pain in left knee: Secondary | ICD-10-CM | POA: Diagnosis not present

## 2020-04-19 DIAGNOSIS — K611 Rectal abscess: Secondary | ICD-10-CM | POA: Diagnosis not present

## 2020-04-19 DIAGNOSIS — L0231 Cutaneous abscess of buttock: Secondary | ICD-10-CM | POA: Diagnosis not present

## 2020-04-19 DIAGNOSIS — I10 Essential (primary) hypertension: Secondary | ICD-10-CM | POA: Diagnosis not present

## 2020-04-19 DIAGNOSIS — L0591 Pilonidal cyst without abscess: Secondary | ICD-10-CM | POA: Diagnosis not present

## 2020-04-20 DIAGNOSIS — L0231 Cutaneous abscess of buttock: Secondary | ICD-10-CM | POA: Diagnosis not present

## 2020-04-20 DIAGNOSIS — I10 Essential (primary) hypertension: Secondary | ICD-10-CM | POA: Diagnosis not present

## 2020-04-22 ENCOUNTER — Ambulatory Visit (INDEPENDENT_AMBULATORY_CARE_PROVIDER_SITE_OTHER): Payer: BC Managed Care – PPO | Admitting: Family Medicine

## 2020-04-22 ENCOUNTER — Other Ambulatory Visit: Payer: Self-pay

## 2020-04-22 ENCOUNTER — Encounter (INDEPENDENT_AMBULATORY_CARE_PROVIDER_SITE_OTHER): Payer: Self-pay | Admitting: Family Medicine

## 2020-04-22 VITALS — BP 112/67 | HR 85 | Temp 98.4°F | Ht 72.0 in | Wt 284.0 lb

## 2020-04-22 DIAGNOSIS — Z9189 Other specified personal risk factors, not elsewhere classified: Secondary | ICD-10-CM

## 2020-04-22 DIAGNOSIS — F418 Other specified anxiety disorders: Secondary | ICD-10-CM | POA: Diagnosis not present

## 2020-04-22 DIAGNOSIS — R739 Hyperglycemia, unspecified: Secondary | ICD-10-CM | POA: Diagnosis not present

## 2020-04-22 DIAGNOSIS — E039 Hypothyroidism, unspecified: Secondary | ICD-10-CM | POA: Diagnosis not present

## 2020-04-22 DIAGNOSIS — I10 Essential (primary) hypertension: Secondary | ICD-10-CM | POA: Diagnosis not present

## 2020-04-22 DIAGNOSIS — E785 Hyperlipidemia, unspecified: Secondary | ICD-10-CM | POA: Diagnosis not present

## 2020-04-22 DIAGNOSIS — Z1331 Encounter for screening for depression: Secondary | ICD-10-CM

## 2020-04-22 DIAGNOSIS — R5383 Other fatigue: Secondary | ICD-10-CM | POA: Diagnosis not present

## 2020-04-22 DIAGNOSIS — Z0289 Encounter for other administrative examinations: Secondary | ICD-10-CM

## 2020-04-22 DIAGNOSIS — R0602 Shortness of breath: Secondary | ICD-10-CM

## 2020-04-22 DIAGNOSIS — R6889 Other general symptoms and signs: Secondary | ICD-10-CM | POA: Diagnosis not present

## 2020-04-22 DIAGNOSIS — Z6838 Body mass index (BMI) 38.0-38.9, adult: Secondary | ICD-10-CM

## 2020-04-22 NOTE — Progress Notes (Signed)
Office: [phone]  /  Fax: [phone]    Date: April 26, 2020   Appointment Start Time: 3:08pm Duration: 39 minutes Provider: Lawerance CruelGaytri Kaelie Hancock, Psy.D. Type of Session: Intake for Individual Therapy  Location of Patient: Home Location of Provider: Provider's Home (private office) Type of Contact: Telepsychological Visit via MyChart Video Visit  Informed Consent: This provider called Ryan Hancock at 3:05pm as he did not present for the telepsychological appointment. Assistance on connecting was provided. As such, today's appointment was initiated 8 minutes late. Prior to proceeding with today's appointment, two pieces of identifying information were obtained. In addition, Ryan Hancock's physical location at the time of this appointment was obtained as well a phone number he could be reached at in the event of technical difficulties. Ryan Hancock and this provider participated in today's telepsychological service.   The provider's role was explained to Ryan Hancock. The provider reviewed and discussed issues of confidentiality, privacy, and limits therein (e.g., reporting obligations). In addition to verbal informed consent, written informed consent for psychological services was obtained prior to the initial appointment. Since the clinic is not a 24/7 crisis center, mental health emergency resources were shared and this  provider explained MyChart, e-mail, voicemail, and/or other messaging systems should be utilized only for non-emergency reasons. This provider also explained that information obtained during appointments will be placed in Ryan Hancock's medical record and relevant information will be shared with other providers at [facility] for coordination of care. Ryan Hancock agreed information may be shared with other [facility] providers as needed for coordination of care and by signing the service agreement document, he provided written consent for coordination of care. Prior to initiating  telepsychological services, Ryan Hancock completed an informed consent document, which included the development of a safety plan (i.e., an emergency contact and emergency resources) in the event of an emergency/crisis. Ryan Hancock expressed understanding of the rationale of the safety plan. Ryan Hancock verbally acknowledged understanding he is ultimately responsible for understanding his insurance benefits for telepsychological and in-person services. This provider also reviewed confidentiality, as it relates to telepsychological services, as well as the rationale for telepsychological services (i.e., to reduce exposure risk to COVID-19). Ryan Hancock  acknowledged understanding that appointments cannot be recorded without both party consent and he is aware he is responsible for securing confidentiality on his end of the session. Ryan Hancock verbally consented to proceed.  Chief Complaint/HPI: Ryan Hancock was referred by Dr. Quillian Quincearen Hancock due to depression with anxiety. Ryan Hancock's Food and Mood (modified PHQ-9) score on April 22, 2020 was 18.  During today's appointment, Ryan Hancock reported a history of anxiety and depression symptoms. He was verbally administered a questionnaire assessing various behaviors related to emotional eating. Ryan Hancock endorsed the following: overeat when you are celebrating, experience food cravings on a regular basis, eat certain foods when you are anxious, stressed, depressed, or your feelings are hurt, use food to help you cope with emotional situations, find food is comforting to you, overeat when you are angry or upset, overeat when you are worried about something, overeat frequently when you are bored or lonely and eat as a reward. Ryan Hancock believes the onset of emotional eating was likely in childhood and described the current frequency of emotional eating as "few times a week." In addition, Ryan Hancock denied a history of binge eating. Ryan Hancock described a history of restricting food intake due to dieting. He recalled in the late 1980s he went on a  "strict" diet, which consisted of eating only 1,200 calories. He denied a history of  purging and engagement in other compensatory strategies for weight loss, and has never been diagnosed with an eating disorder. He also denied a history of treatment for emotional eating. Currently, Ryan Hancock indicated boredom triggers emotional eating, whereas staying active and working on projects makes emotional eating better. 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  Mental Status Examination:  Appearance: well groomed and appropriate hygiene  Behavior: appropriate to circumstances Mood: euthymic Affect: mood congruent Speech: normal in rate, volume, and tone Eye Contact: appropriate Psychomotor Activity: unable to assess  Gait: unable to assess Thought Process: linear, logical, and goal directed  Thought Content/Perception: denies suicidal and homicidal ideation, plan, and intent and no hallucinations, delusions, bizarre thinking or behavior reported or observed Orientation: time, person, place, and purpose of appointment Memory/Concentration: memory, attention, language, and fund of knowledge intact  Insight/Judgment: good  Family & Psychosocial History: Ryan Hancock reported he is married and he has two adult sons. 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 Additionally, Ryan Hancock shared his highest level of education obtained is a bachelor degree. Currently, Ryan Hancock's social support system consists of his children and wife. Moreover, Ryan Hancock stated he resides with his wife, oldest son, and dog.   Medical History:  Past Medical History:  Diagnosis Date  . Anxiety   . Arthritis   . Depression   . Gallbladder problem   . Hip pain   . Hyperlipidemia   . Hypertension   . Hypothyroidism   . Joint pain   . Knee pain   . Other fatigue   . Prediabetes   . Shortness of breath on exertion   . Sleep apnea    Past Surgical History:  Procedure Laterality Date  . APPENDECTOMY  1994  .  CHOLECYSTECTOMY  1996  . MENISCUS REPAIR  2002   right knee  . 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 buPROPion (WELLBUTRIN XL) 150 MG 24 hr tablet Take 150 mg by mouth daily.    Marland Kitchen levothyroxine (SYNTHROID, LEVOTHROID) 50 MCG tablet Take 50 mcg by mouth daily before breakfast.    . lisinopril (PRINIVIL,ZESTRIL) 10 MG tablet Take 10 mg by mouth daily.    Marland Kitchen LORazepam (ATIVAN) 0.5 MG tablet Take 0.5 mg by mouth as needed for anxiety. Take 1 tablet (0.5mg  total) by mouth as needed when flying.    . ondansetron (ZOFRAN) 8 MG tablet Take 8 mg by mouth every 8 (eight) hours as needed.    . pravastatin (PRAVACHOL) 40 MG tablet Take 40 mg by mouth daily.    . sertraline (ZOLOFT) 100 MG tablet Take 100 mg by mouth daily.      No current facility-administered medications on file prior to visit.   Mental Health History: Ryan Hancock reported he attended therapeutic services around 2005 for anger management and symptoms of depression, noting, "It didn't go well." Currently, he indicated his PCP prescribes Wellbutrin, Ativan, and Zoloft. He denied concerns with his medications. Ryan Hancock reported there is no history of hospitalizations for psychiatric concerns. Ryan Hancock reported his father was treated for anxiety and his mother and maternal aunts had a history of depression. Ryan Hancock reported there is no history of trauma including psychological, physical  and sexual abuse, as well as neglect. However, Ryan Register  shared his father was "cold" and "not an easy person."  Ryan Hancock discussed a history of suicidal ideation starting around his late teens/early 74s. He denied a history of plan and intent. Ryan Hancock indicated he last experienced suicidal ideation in the past couple years. Again, he denied  experiencing suicidal plan and intent. He described the thoughts as fleeting. Notably, Kayshawn endorsed item 9 (i.e., "Do you feel that your weight problem is so hopeless that sometimes life doesn't seem worth living?") on the modified PHQ-9 during his initial appointment with Dr. Quillian Quince on April 22, 2020. Ryan Hancock reported he endorsed the item due to feeling stuck with his weight loss and not due to suicidal ideation. The following protective factors were identified for Ryan Hancock: family (e.g., sons, wife, and brothers) and future (e.g., grandchildren). If he were to become overwhelmed in the future, which is a sign that a crisis may occur, he identified the following coping skills he could engage in: gardening, engaging in household projects, watching sports, traveling, and reading. It was recommended the aforementioned be written down and developed into a coping card for future reference. Psychoeducation regarding the importance of reaching out to a trusted individual and/or utilizing emergency resources if there is a change in emotional status and/or there is an inability to ensure safety was provided. Ryan Hancock's confidence in reaching out to a trusted individual and/or utilizing emergency resources should there be an intensification in emotional status and/or there is an inability to ensure safety was assessed on a scale of one to ten where one is not confident and ten is extremely confident. He reported his confidence is a 9, adding "Nobody is a 10." He further noted, "I have too much to lose that I can't risk it."   Ryan Hancock described his typical mood lately as "grey," due to having to deal with an abscess. He also discussed experiencing physical pain. Ryan Hancock endorsed alcohol use prior to starting with the clinic, adding he was consuming one bottle of whiskey (a "fifth") a week. He endorsed occasionally smoking a cigar. He denied illicit/recreational substance use. Regarding caffeine intake, Ryan Hancock reported consuming  approximately 30oz of coffee daily. Furthermore, Ryan Hancock indicated he is not experiencing the following: hallucinations and delusions, paranoia, symptoms of mania , social withdrawal, crying spells, panic attacks and decreased motivation. He also denied current suicidal ideation, plan, and intent; history of and current homicidal ideation, plan, and intent; and history of and current engagement in self-harm.   The following strengths were reported by Ryan Register: self-awareness, empathy, pretty smart, and a good person. The following strengths were observed by this provider: ability to express thoughts and feelings during the therapeutic session, ability to establish and benefit from a therapeutic relationship, willingness to work toward established goal(s) with the clinic and ability to engage in reciprocal conversation.   Legal History: Ryan Hancock reported there is no history of legal involvement.   Structured Assessments Results: The Patient Health Questionnaire-9 (PHQ-9) is a self-report measure that assesses symptoms and severity of depression over the course of the last two weeks. Ryan Hancock obtained a score of 2 suggesting minimal depression. Ryan Hancock finds the endorsed symptoms to be somewhat difficult. [0= Not at all; 1= Several days; 2= More than half the days; 3= Nearly every day] Little interest or pleasure in doing things 0  Feeling down, depressed, or hopeless 0  Trouble falling or staying asleep, or sleeping too much 0  Feeling tired or having little energy 0  Poor appetite or overeating 1  Feeling bad about  yourself --- or that you are a failure or have let yourself or your family down 0  Trouble concentrating on things, such as reading the newspaper or watching television 1  Moving or speaking so slowly that other people could have noticed? Or the opposite --- being so fidgety or restless that you have been moving around a lot more than usual 0  Thoughts that you would be better off dead or hurting yourself  in some way 0  PHQ-9 Score 2    The Generalized Anxiety Disorder-7 (GAD-7) is a brief self-report measure that assesses symptoms of anxiety over the course of the last two weeks. Ryan Hancock obtained a score of 1 suggesting minimal anxiety. Rutilio finds the endorsed symptoms to be not difficult at all. [0= Not at all; 1= Several days; 2= Over half the days; 3= Nearly every day] Feeling nervous, anxious, on edge 0  Not being able to stop or control worrying 0  Worrying too much about different things 0  Trouble relaxing 0  Being so restless that it's hard to sit still 0  Becoming easily annoyed or irritable 1  Feeling afraid as if something awful might happen 0  GAD-7 Score 1   Interventions:  Conducted a chart review Focused on rapport building Verbally administered PHQ-9 and GAD-7 for symptom monitoring Verbally administered Food & Mood questionnaire to assess various behaviors related to emotional eating Provided emphatic reflections and validation Collaborated with patient on a treatment goal  Psychoeducation provided regarding physical versus emotional hunger  Provisional DSM-5 Diagnosis(es): 311 (F32.8) Other Specified Depressive Disorder, Emotional Eating Behaviors  Plan: Audie appears able and willing to participate as evidenced by collaboration on a treatment goal, engagement in reciprocal conversation, and asking questions as needed for clarification. The next appointment will be scheduled in two weeks, which will be via MyChart Video Visit. The following treatment goal was established: increase coping skills. This provider will regularly review the treatment plan and medical chart to keep informed of status changes. Barney expressed understanding and agreement with the initial treatment plan of care. Niall will be sent a handout via e-mail to utilize between now and the next appointment to increase awareness of hunger patterns and subsequent eating. Argelio provided verbal consent during today's  appointment for this provider to send the handout via e-mail.

## 2020-04-23 LAB — LIPID PANEL WITH LDL/HDL RATIO
Cholesterol, Total: 169 mg/dL (ref 100–199)
HDL: 48 mg/dL (ref >39)
LDL Chol Calc (NIH): 97 mg/dL (ref 0–99)
LDL/HDL Ratio: 2 ratio (ref 0.0–3.6)
Triglycerides: 137 mg/dL (ref 0–149)
VLDL Cholesterol Cal: 24 mg/dL (ref 5–40)

## 2020-04-23 LAB — CBC WITH DIFFERENTIAL/PLATELET
Basophils Absolute: 0.1 10*3/uL (ref 0.0–0.2)
Basos: 1 %
EOS (ABSOLUTE): 0.2 10*3/uL (ref 0.0–0.4)
Eos: 2 %
Hematocrit: 44 % (ref 37.5–51.0)
Hemoglobin: 15.1 g/dL (ref 13.0–17.7)
Immature Grans (Abs): 0 10*3/uL (ref 0.0–0.1)
Immature Granulocytes: 0 %
Lymphocytes Absolute: 2.1 10*3/uL (ref 0.7–3.1)
Lymphs: 24 %
MCH: 32.2 pg (ref 26.6–33.0)
MCHC: 34.3 g/dL (ref 31.5–35.7)
MCV: 94 fL (ref 79–97)
Monocytes Absolute: 0.5 10*3/uL (ref 0.1–0.9)
Monocytes: 6 %
Neutrophils Absolute: 5.7 10*3/uL (ref 1.4–7.0)
Neutrophils: 67 %
Platelets: 309 10*3/uL (ref 150–450)
RBC: 4.69 x10E6/uL (ref 4.14–5.80)
RDW: 12.4 % (ref 11.6–15.4)
WBC: 8.6 10*3/uL (ref 3.4–10.8)

## 2020-04-23 LAB — COMPREHENSIVE METABOLIC PANEL
ALT: 28 IU/L (ref 0–44)
AST: 19 IU/L (ref 0–40)
Albumin/Globulin Ratio: 1.9 (ref 1.2–2.2)
Albumin: 4.4 g/dL (ref 3.8–4.9)
Alkaline Phosphatase: 83 IU/L (ref 44–121)
BUN/Creatinine Ratio: 11 (ref 10–24)
BUN: 10 mg/dL (ref 8–27)
Bilirubin Total: 0.5 mg/dL (ref 0.0–1.2)
CO2: 21 mmol/L (ref 20–29)
Calcium: 9.3 mg/dL (ref 8.6–10.2)
Chloride: 104 mmol/L (ref 96–106)
Creatinine, Ser: 0.87 mg/dL (ref 0.76–1.27)
Globulin, Total: 2.3 g/dL (ref 1.5–4.5)
Glucose: 140 mg/dL — ABNORMAL HIGH (ref 65–99)
Potassium: 4.5 mmol/L (ref 3.5–5.2)
Sodium: 143 mmol/L (ref 134–144)
Total Protein: 6.7 g/dL (ref 6.0–8.5)
eGFR: 99 mL/min/{1.73_m2} (ref >59)

## 2020-04-23 LAB — VITAMIN D 25 HYDROXY (VIT D DEFICIENCY, FRACTURES): Vit D, 25-Hydroxy: 41.9 ng/mL (ref 30.0–100.0)

## 2020-04-23 LAB — TSH: TSH: 3.19 u[IU]/mL (ref 0.450–4.500)

## 2020-04-23 LAB — INSULIN, RANDOM: INSULIN: 16 u[IU]/mL (ref 2.6–24.9)

## 2020-04-23 LAB — T3: T3, Total: 110 ng/dL (ref 71–180)

## 2020-04-23 LAB — HEMOGLOBIN A1C
Est. average glucose Bld gHb Est-mCnc: 140 mg/dL
Hgb A1c MFr Bld: 6.5 % — ABNORMAL HIGH (ref 4.8–5.6)

## 2020-04-23 LAB — FOLATE: Folate: 13.9 ng/mL (ref >3.0)

## 2020-04-23 LAB — T4: T4, Total: 7 ug/dL (ref 4.5–12.0)

## 2020-04-23 LAB — VITAMIN B12: Vitamin B-12: 311 pg/mL (ref 232–1245)

## 2020-04-26 ENCOUNTER — Telehealth (INDEPENDENT_AMBULATORY_CARE_PROVIDER_SITE_OTHER): Payer: BC Managed Care – PPO | Admitting: Psychology

## 2020-04-26 DIAGNOSIS — K5900 Constipation, unspecified: Secondary | ICD-10-CM | POA: Diagnosis not present

## 2020-04-26 DIAGNOSIS — F3289 Other specified depressive episodes: Secondary | ICD-10-CM | POA: Diagnosis not present

## 2020-04-26 DIAGNOSIS — K611 Rectal abscess: Secondary | ICD-10-CM | POA: Diagnosis not present

## 2020-04-26 DIAGNOSIS — K605 Anorectal fistula: Secondary | ICD-10-CM | POA: Diagnosis not present

## 2020-04-26 DIAGNOSIS — E119 Type 2 diabetes mellitus without complications: Secondary | ICD-10-CM | POA: Diagnosis not present

## 2020-04-26 DIAGNOSIS — R110 Nausea: Secondary | ICD-10-CM | POA: Diagnosis not present

## 2020-04-26 NOTE — Progress Notes (Unsigned)
Office: [phone]  /  Fax: [phone]    Date: May 10, 2020   Appointment Start Time: *** Duration: *** minutes Provider: Lawerance Cruel, Psy.D. Type of Session: Individual Therapy  Location of Patient: {gbptloc:23249} Location of Provider: Provider's Home (private office) Type of Contact: Telepsychological Visit via MyChart Video Visit  Session Content: This provider called [name] at 4:02pm as he did not present for the telepsychological appointment. A HIPAA compliant voicemail was left requesting a call back. As such, today's appointment was initiated *** minutes late.  Vedh is a 60 y.o. male presenting for a follow-up appointment to address the previously established treatment goal of increasing coping skills. Today's appointment was a telepsychological visit due to COVID-19. Adyan provided verbal consent for today's telepsychological appointment and he is aware he is responsible for securing confidentiality on his end of the session. Prior to proceeding with today's appointment, Wagner's physical location at the time of this appointment was obtained as well a phone number he could be reached at in the event of technical difficulties. Hanad and this provider participated in today's telepsychological service.   This provider conducted a brief check-in and verbally administered the PHQ-9 and GAD-7. ***Psychoeducation regarding triggers for emotional eating was provided. Kaj was provided a handout, and encouraged to utilize the handout between now and the next appointment to increase awareness of triggers and frequency. Candice agreed. This provider also discussed behavioral strategies for specific triggers, such as placing the utensil down when conversing to avoid mindless eating. Tevis provided verbal consent during today's appointment for this provider to send *** via e-mail.  Jadyn was receptive to today's appointment as evidenced by openness to sharing, responsiveness to feedback, and  {gbreceptiveness:23401}.  Mental Status Examination:  Appearance: {Appearance:22431} Behavior: {Behavior:22445} Mood: {gbmood:21757} Affect: {Affect:22436} Speech: {Speech:22432} Eye Contact: {Eye Contact:22433} Psychomotor Activity: {Motor Activity:22434} Gait: {gbgait:23404} Thought Process: {thought process:22448}  Thought Content/Perception: {disturbances:22451} Orientation: {Orientation:22437} Memory/Concentration: {gbcognition:22449} Insight/Judgment: {Insight:22446}  Structured Assessments Results: The Patient Health Questionnaire-9 (PHQ-9) is a self-report measure that assesses symptoms and severity of depression over the course of the last two weeks. Bracken obtained a score of *** suggesting {GBPHQ9SEVERITY:21752}. Devereaux finds the endorsed symptoms to be {gbphq9difficulty:21754}. [0= Not at all; 1= Several days; 2= More than half the days; 3= Nearly every day] Little interest or pleasure in doing things ***  Feeling down, depressed, or hopeless ***  Trouble falling or staying asleep, or sleeping too much ***  Feeling tired or having little energy ***  Poor appetite or overeating ***  Feeling bad about yourself --- or that you are a failure or have let yourself or your family down ***  Trouble concentrating on things, such as reading the newspaper or watching television ***  Moving or speaking so slowly that other people could have noticed? Or the opposite --- being so fidgety or restless that you have been moving around a lot more than usual ***  Thoughts that you would be better off dead or hurting yourself in some way ***  PHQ-9 Score ***    The Generalized Anxiety Disorder-7 (GAD-7) is a brief self-report measure that assesses symptoms of anxiety over the course of the last two weeks. Jermar obtained a score of *** suggesting {gbgad7severity:21753}. Onaje finds the endorsed symptoms to be {gbphq9difficulty:21754}. [0= Not at all; 1= Several days; 2= Over half the days; 3= Nearly  every day] Feeling nervous, anxious, on edge ***  Not being able to stop or control worrying ***  Worrying too much about  different things ***  Trouble relaxing ***  Being so restless that it's hard to sit still ***  Becoming easily annoyed or irritable ***  Feeling afraid as if something awful might happen ***  GAD-7 Score ***   Interventions:  {Interventions for Progress Notes:23405}  DSM-5 Diagnosis(es): 311 (F32.8) Other Specified Depressive Disorder, Emotional Eating Behaviors  Treatment Goal & Progress: During the initial appointment with this provider, the following treatment goal was established: increase coping skills. Aneesh has demonstrated progress in his goal as evidenced by {gbtxprogress:22839}. Jedd also {gbtxprogress2:22951}.  Plan: The next appointment will be scheduled in {gbweeks:21758}, which will be {gbtxmodality:23402}. The next session will focus on {Plan for Next Appointment:23400}.

## 2020-04-27 DIAGNOSIS — K5900 Constipation, unspecified: Secondary | ICD-10-CM | POA: Diagnosis not present

## 2020-04-27 DIAGNOSIS — K611 Rectal abscess: Secondary | ICD-10-CM | POA: Diagnosis not present

## 2020-04-28 NOTE — Progress Notes (Signed)
Chief Complaint:   OBESITY Ryan Hancock (MR# [id number]) is a 60 y.o. male who presents for evaluation and treatment of obesity and related comorbidities. Current BMI is Body mass index is 38.52 kg/m. Ryan Hancock has been struggling with his weight for many years and has been unsuccessful in either losing weight, maintaining weight loss, or reaching his healthy weight goal.  Ryan Hancock is currently in the action stage of change and ready to dedicate time achieving and maintaining a healthier weight. Ryan Hancock is interested in becoming our patient and working on intensive lifestyle modifications including (but not limited to) diet and exercise for weight loss.  Ryan Hancock's habits were reviewed today and are as follows: His family eats meals together, he thinks his family will eat healthier with him, he struggles with family and or coworkers weight loss sabotage, his desired weight loss is 69 lbs, he has been heavy most of his life, he started gaining weight, he has been up and down all his life, his heaviest weight ever was 295 pounds, he has significant food cravings issues, he snacks frequently in the evenings, he is frequently drinking liquids with calories, he frequently makes poor food choices, he frequently eats larger portions than normal and he struggles with emotional eating.  Depression Screen Ryan Hancock's Food and Mood (modified PHQ-9) score was 18.  Depression screen PHQ 2/9 04/22/2020  Decreased Interest 3  Down, Depressed, Hopeless 2  PHQ - 2 Score 5  Altered sleeping 3  Tired, decreased energy 2  Change in appetite 2  Feeling bad or failure about yourself  3  Trouble concentrating 1  Moving slowly or fidgety/restless 1  Suicidal thoughts 1  PHQ-9 Score 18  Difficult doing work/chores Somewhat difficult   Subjective:   1. Other fatigue Ryan Hancock admits to daytime somnolence and admits to waking up still tired. Patent has a history of symptoms of daytime fatigue. Ryan Hancock generally gets 8 hours of sleep  per night, and states that he has generally restful sleep. Snoring is present. Apneic episodes are present. Epworth Sleepiness Score is 6.  2. Shortness of breath on exertion Ryan Hancock notes increasing shortness of breath with exercising and seems to be worsening over time with weight gain. He notes getting out of breath sooner with activity than he used to. This has not gotten worse recently. [name] denies shortness of breath at rest or orthopnea.  3. Primary hypertension Ryan Hancock's blood pressure is controlled today. He is on lisinopril and he is ready to work on diet and weight loss.  4. Hyperlipidemia, unspecified hyperlipidemia type Ryan Hancock is on pravastatin, and he is ready to work on diet.  5. Hypothyroidism, unspecified type Ryan Hancock is on 50 mcg of levothyroxine, and he still notes fatigue. He denies signs of tremors.  6. Hyperglycemia Ryan Hancock has a history of pre-diabetes. He is working on diet and weight loss. He has a gluteal abscess that is being treated by his primary care physician. This is the 2nd one in the same area.  7. Depression with anxiety Ryan Hancock is on Zoloft and Wellbutrin, and Lorazepam (when flying). He notes emotional eating behaviors.  8. At risk for heart disease Ryan Hancock is at a higher than average risk for cardiovascular disease due to obesity.   Assessment/Plan:   1. Other fatigue Ryan Hancock does feel that his weight is causing his energy to be lower than it should be. Fatigue may be related to obesity, depression or many other causes. Labs will be ordered, and in the meanwhile, Ryan Hancock  will focus on self care including making healthy food choices, increasing physical activity and focusing on stress reduction.  - Vitamin B12 - CBC with Differential/Platelet - Comprehensive metabolic panel - EKG 12-Lead - Folate - VITAMIN D 25 Hydroxy (Vit-D Deficiency, Fractures)  2. Shortness of breath on exertion Ryan Hancock does feel that he gets out of breath more easily that he used to when he  exercises. Ryan Hancock's shortness of breath appears to be obesity related and exercise induced. He has agreed to work on weight loss and gradually increase exercise to treat his exercise induced shortness of breath. Will continue to monitor closely.  3. Primary hypertension We will check labs today. Ryan Hancock will start his Category 4 plan, and will work on weight loss and exercise to improve blood pressure control. We will watch for signs of hypotension as he continues his lifestyle modifications.  4. Hyperlipidemia, unspecified hyperlipidemia type Cardiovascular risk and specific lipid/LDL goals reviewed. We discussed several lifestyle modifications today. We will check labs today. Ryan Hancock will start his Category 4 plan, and will continue to work on exercise and weight loss efforts. Orders and follow up as documented in patient record.   - Lipid Panel With LDL/HDL Ratio  5. Hypothyroidism, unspecified type We will check labs today. Ryan Hancock will continue to follow up as directed. Orders and follow up as documented in patient record.  - T3 - T4 - TSH  6. Hyperglycemia Fasting labs will be obtained and results with be discussed with Ryan Hancock in 2 weeks at his follow up visit. In the meanwhile Ryan Hancock will start his Category 4 plan and will work on weight loss efforts.  - Hemoglobin A1c - Insulin, random  7. Depression with anxiety Behavior modification techniques were discussed today to help Ryan Hancock deal with his emotional/non-hunger eating behaviors. We will refer Ryan Hancock to Dr. Dewaine Hancock, our Bariatric Psychologist for evaluation. Orders and follow up as documented in patient record.   8. At risk for heart disease Ryan Hancock was given approximately 30 minutes of coronary artery disease prevention counseling today. He is 60 y.o. male and has risk factors for heart disease including obesity. We discussed intensive lifestyle modifications today with an emphasis on specific weight loss instructions and strategies.   Repetitive  spaced learning was employed today to elicit superior memory formation and behavioral change.  9. Class 2 severe obesity with serious comorbidity and body mass index (BMI) of 38.0 to 38.9 in adult, unspecified obesity type [facility]) Telly is currently in the action stage of change and his goal is to continue with weight loss efforts. I recommend Hank begin the structured treatment plan as follows:  He has agreed to the Category 4 Plan.  Exercise goals: No exercise has been prescribed for now, while we concentrate on nutritional changes.  Behavioral modification strategies: increasing lean protein intake and meal planning and cooking strategies.  He was informed of the importance of frequent follow-up visits to maximize his success with intensive lifestyle modifications for his multiple health conditions. He was informed we would discuss his lab results at his next visit unless there is a critical issue that needs to be addressed sooner. Johannes agreed to keep his next visit at the agreed upon time to discuss these results.  Objective:   Blood pressure 112/67, pulse 85, temperature 98.4 F (36.9 C), height 6' (1.829 m), weight 284 lb (128.8 kg), SpO2 96 %. Body mass index is 38.52 kg/m.  EKG: Normal sinus rhythm, rate 90 BPM.  Indirect Calorimeter completed today shows  a VO2 of 375 and a REE of 2612.  His calculated basal metabolic rate is 9735 thus his basal metabolic rate is worse than expected.  General: Cooperative, alert, well developed, in no acute distress. HEENT: Conjunctivae and lids unremarkable. Cardiovascular: Regular rhythm.  Lungs: Normal work of breathing. Neurologic: No focal deficits.   Lab Results  Component Value Date   CREATININE 0.87 04/22/2020   BUN 10 04/22/2020   NA 143 04/22/2020   K 4.5 04/22/2020   CL 104 04/22/2020   CO2 21 04/22/2020   Lab Results  Component Value Date   ALT 28 04/22/2020   AST 19 04/22/2020   ALKPHOS 83 04/22/2020   BILITOT 0.5  04/22/2020   Lab Results  Component Value Date   HGBA1C 6.5 (H) 04/22/2020   Lab Results  Component Value Date   INSULIN 16.0 04/22/2020   Lab Results  Component Value Date   TSH 3.190 04/22/2020   Lab Results  Component Value Date   CHOL 169 04/22/2020   HDL 48 04/22/2020   LDLCALC 97 04/22/2020   TRIG 137 04/22/2020   Lab Results  Component Value Date   WBC 8.6 04/22/2020   HGB 15.1 04/22/2020   HCT 44.0 04/22/2020   MCV 94 04/22/2020   PLT 309 04/22/2020   No results found for: IRON, TIBC, FERRITIN  Attestation Statements:   Reviewed by clinician on day of visit: allergies, medications, problem list, medical history, surgical history, family history, social history, and previous encounter notes.   I, Burt Knack, am acting as transcriptionist for Quillian Quince, MD.  I have reviewed the above documentation for accuracy and completeness, and I agree with the above. - Quillian Quince, MD

## 2020-04-29 DIAGNOSIS — K611 Rectal abscess: Secondary | ICD-10-CM | POA: Diagnosis not present

## 2020-04-29 DIAGNOSIS — K5900 Constipation, unspecified: Secondary | ICD-10-CM | POA: Diagnosis not present

## 2020-04-29 DIAGNOSIS — E119 Type 2 diabetes mellitus without complications: Secondary | ICD-10-CM | POA: Diagnosis not present

## 2020-05-06 ENCOUNTER — Ambulatory Visit (INDEPENDENT_AMBULATORY_CARE_PROVIDER_SITE_OTHER): Payer: BC Managed Care – PPO | Admitting: Family Medicine

## 2020-05-06 ENCOUNTER — Encounter (INDEPENDENT_AMBULATORY_CARE_PROVIDER_SITE_OTHER): Payer: Self-pay | Admitting: Family Medicine

## 2020-05-06 ENCOUNTER — Other Ambulatory Visit: Payer: Self-pay

## 2020-05-06 VITALS — BP 128/66 | HR 78 | Temp 98.0°F | Ht 72.0 in | Wt 277.0 lb

## 2020-05-06 DIAGNOSIS — E7849 Other hyperlipidemia: Secondary | ICD-10-CM

## 2020-05-06 DIAGNOSIS — Z9189 Other specified personal risk factors, not elsewhere classified: Secondary | ICD-10-CM

## 2020-05-06 DIAGNOSIS — Z6838 Body mass index (BMI) 38.0-38.9, adult: Secondary | ICD-10-CM

## 2020-05-06 DIAGNOSIS — E559 Vitamin D deficiency, unspecified: Secondary | ICD-10-CM | POA: Diagnosis not present

## 2020-05-06 DIAGNOSIS — E1169 Type 2 diabetes mellitus with other specified complication: Secondary | ICD-10-CM | POA: Diagnosis not present

## 2020-05-06 MED ORDER — VITAMIN D (ERGOCALCIFEROL) 1.25 MG (50000 UNIT) PO CAPS: 50000.0000 [IU] | ORAL_CAPSULE | ORAL | 0 refills | Status: DC

## 2020-05-10 ENCOUNTER — Telehealth (INDEPENDENT_AMBULATORY_CARE_PROVIDER_SITE_OTHER): Payer: BC Managed Care – PPO | Admitting: Psychology

## 2020-05-10 ENCOUNTER — Telehealth (INDEPENDENT_AMBULATORY_CARE_PROVIDER_SITE_OTHER): Payer: Self-pay | Admitting: Psychology

## 2020-05-10 NOTE — Telephone Encounter (Signed)
  Office: [phone]  /  Fax: [phone]  Date of Call: May 10, 2020  Time of Call: 4:02pm Provider: Lawerance Cruel, PsyD  CONTENT:  This provider called Ryan Hancock to check-in as he did not present for today's MyChart Video Visit appointment at 4:00pm. A HIPAA compliant voicemail was left requesting a call back. Of note, this provider stayed on the MyChart Video Visit appointment for 5 minutes prior to signing off per the clinic's grace period policy.    PLAN: This provider will wait for [name] to call back. No further follow-up planned by this provider.

## 2020-05-12 NOTE — Progress Notes (Signed)
Chief Complaint:   OBESITY Ryan Hancock is here to discuss his progress with his obesity treatment plan along with follow-up of his obesity related diagnoses. Ryan Hancock is on the Category 4 Plan and states he is following his eating plan approximately 99% of the time. Ryan Hancock states he is doing 0 minutes 0 times per week.  Today's visit was #: 2 Starting weight: 284 lbs Starting date: 04/22/2020 Today's weight: 277 lbs Today's date: 05/06/2020 Total lbs lost to date: 7 Total lbs lost since last in-office visit: 7  Interim History: Ryan Hancock has done well with weight loss on his Category 4 plan. He notes his hunger was mostly controlled. He liked his food choices, but he would like to eat more vegetables.  Subjective:   1. Type 2 diabetes mellitus with other specified complication, without long-term current use of insulin (HCC) Ryan Hancock has a new diagnosis of diabetes mellitus II. His A1c is now 6.5 and fasting glucose is >126. He has done very well with his eating plan and has not had any episodes of hypoglycemia. He notes decreased polyphagia as well. I discussed labs with the patient today.  2. Vitamin D deficiency Ryan Hancock's Vit D level is lower than goal. He has likely been on Vit D drops to prevent COVID (but not sure). He has had Vit D deficiency in the past. I discussed labs with the patient today.  3. Other hyperlipidemia Ryan Hancock is on statin and his labs look very good. He is working on weight loss and would like to be able to decrease his medications. I discussed labs with the patient today.  4. At risk for heart disease Ryan Hancock is at a higher than average risk for cardiovascular disease due to obesity.   Assessment/Plan:   1. Type 2 diabetes mellitus with other specified complication, without long-term current use of insulin (HCC) Good blood sugar control is important to decrease the likelihood of diabetic complications such as nephropathy, neuropathy, limb loss, blindness, coronary artery disease,  and death. Intensive lifestyle modification including diet, exercise and weight loss are the first line of treatment for diabetes. Ryan Hancock continues diet and exercise, and we will recheck labs in 3 months.  2. Vitamin D deficiency Low Vitamin D level contributes to fatigue and are associated with obesity, breast, and colon cancer. Ryan Hancock agreed to start prescription Vitamin D 50,000 IU every week with no refills. He will follow-up for routine testing of Vitamin D, at least 2-3 times per year to avoid over-replacement.  - Vitamin D, Ergocalciferol, (DRISDOL) 1.25 MG (50000 UNIT) CAPS capsule; Take 1 capsule (50,000 Units total) by mouth every 7 (seven) days.  Dispense: 4 capsule; Refill: 0  3. Other hyperlipidemia Cardiovascular risk and specific lipid/LDL goals reviewed. We discussed several lifestyle modifications today. Ryan Hancock will continue to work on diet, exercise and weight loss efforts. We will recheck labs in 3 months. Orders and follow up as documented in patient record.   4. At risk for heart disease Ryan Hancock was given approximately 30 minutes of coronary artery disease prevention counseling today. He is 60 y.o. male and has risk factors for heart disease including obesity. We discussed intensive lifestyle modifications today with an emphasis on specific weight loss instructions and strategies.   Repetitive spaced learning was employed today to elicit superior memory formation and behavioral change.  5. Obesity with current BMI of 37.57 Ryan Hancock is currently in the action stage of change. As such, his goal is to continue with weight loss efforts. He has  agreed to the Category 4 Plan.   Behavioral modification strategies: increasing lean protein intake and meal planning and cooking strategies.  Ryan Hancock has agreed to follow-up with our clinic in 2 weeks. He was informed of the importance of frequent follow-up visits to maximize his success with intensive lifestyle modifications for his multiple health  conditions.   Objective:   Blood pressure 128/66, pulse 78, temperature 98 F (36.7 C), height 6' (1.829 m), weight 277 lb (125.6 kg), SpO2 97 %. Body mass index is 37.57 kg/m.  General: Cooperative, alert, well developed, in no acute distress. HEENT: Conjunctivae and lids unremarkable. Cardiovascular: Regular rhythm.  Lungs: Normal work of breathing. Neurologic: No focal deficits.   Lab Results  Component Value Date   CREATININE 0.87 04/22/2020   BUN 10 04/22/2020   NA 143 04/22/2020   K 4.5 04/22/2020   CL 104 04/22/2020   CO2 21 04/22/2020   Lab Results  Component Value Date   ALT 28 04/22/2020   AST 19 04/22/2020   ALKPHOS 83 04/22/2020   BILITOT 0.5 04/22/2020   Lab Results  Component Value Date   HGBA1C 6.5 (H) 04/22/2020   Lab Results  Component Value Date   INSULIN 16.0 04/22/2020   Lab Results  Component Value Date   TSH 3.190 04/22/2020   Lab Results  Component Value Date   CHOL 169 04/22/2020   HDL 48 04/22/2020   LDLCALC 97 04/22/2020   TRIG 137 04/22/2020   Lab Results  Component Value Date   WBC 8.6 04/22/2020   HGB 15.1 04/22/2020   HCT 44.0 04/22/2020   MCV 94 04/22/2020   PLT 309 04/22/2020   No results found for: IRON, TIBC, FERRITIN  Attestation Statements:   Reviewed by clinician on day of visit: allergies, medications, problem list, medical history, surgical history, family history, social history, and previous encounter notes.   I, Burt Knack, am acting as transcriptionist for Quillian Quince, MD.  I have reviewed the above documentation for accuracy and completeness, and I agree with the above. -  Quillian Quince, MD

## 2020-05-13 ENCOUNTER — Encounter (INDEPENDENT_AMBULATORY_CARE_PROVIDER_SITE_OTHER): Payer: Self-pay | Admitting: Family Medicine

## 2020-05-17 DIAGNOSIS — K610 Anal abscess: Secondary | ICD-10-CM | POA: Diagnosis not present

## 2020-05-25 ENCOUNTER — Encounter (INDEPENDENT_AMBULATORY_CARE_PROVIDER_SITE_OTHER): Payer: Self-pay | Admitting: Family Medicine

## 2020-05-25 ENCOUNTER — Other Ambulatory Visit: Payer: Self-pay

## 2020-05-25 ENCOUNTER — Ambulatory Visit (INDEPENDENT_AMBULATORY_CARE_PROVIDER_SITE_OTHER): Payer: BC Managed Care – PPO | Admitting: Family Medicine

## 2020-05-25 VITALS — BP 116/68 | HR 69 | Temp 98.4°F | Ht 72.0 in | Wt 275.0 lb

## 2020-05-25 DIAGNOSIS — M79672 Pain in left foot: Secondary | ICD-10-CM | POA: Diagnosis not present

## 2020-05-25 DIAGNOSIS — Z9189 Other specified personal risk factors, not elsewhere classified: Secondary | ICD-10-CM | POA: Diagnosis not present

## 2020-05-25 DIAGNOSIS — Z6838 Body mass index (BMI) 38.0-38.9, adult: Secondary | ICD-10-CM

## 2020-05-25 DIAGNOSIS — E559 Vitamin D deficiency, unspecified: Secondary | ICD-10-CM

## 2020-05-25 MED ORDER — VITAMIN D (ERGOCALCIFEROL) 1.25 MG (50000 UNIT) PO CAPS: 50000.0000 [IU] | ORAL_CAPSULE | ORAL | 0 refills | Status: DC

## 2020-05-31 NOTE — Progress Notes (Signed)
Chief Complaint:   OBESITY Ryan Hancock is here to discuss his progress with his obesity treatment plan along with follow-up of his obesity related diagnoses. Ryan Hancock is on the Category 4 Plan and states he is following his eating plan approximately 90% of the time. Ryan Hancock states he is doing 0 minutes 0 times per week.3  Today's visit was #: 3 Starting weight: 284 lbs Starting date: 04/22/2020 Today's weight: 275 lbs Today's date: 05/25/2020 Total lbs lost to date: 9 Total lbs lost since last in-office visit: 2  Interim History: Ryan Hancock continues to do well with weight loss. He did some celebration eating over Easter, but he has done well following his plan otherwise. His hunger is well controlled and he is trying to be creative with his food options. He is eating a lot of vegetables.  Subjective:   1. Vitamin D deficiency Cato is stable on Vit D, but his level is not yet at goal.  2. Left foot pain Ryan Hancock notes left foot pain in his arch and medial malleolus. He had a previous injury which may be contributing. He wears custer orthotics but still has persistent pain.  3. At risk for heart disease Ryan Hancock is at a higher than average risk for cardiovascular disease due to obesity.   Assessment/Plan:   1. Vitamin D deficiency Low Vitamin D level contributes to fatigue and are associated with obesity, breast, and colon cancer. We will refill prescription Vitamin D for 1 month Nasif will follow-up for routine testing of Vitamin D, at least 2-3 times per year to avoid over-replacement.  - Vitamin D, Ergocalciferol, (DRISDOL) 1.25 MG (50000 UNIT) CAPS capsule; Take 1 capsule (50,000 Units total) by mouth every 7 (seven) days.  Dispense: 4 capsule; Refill: 0  2. Left foot pain We will refer [name] to Dr. Ardelle Anton at [facility] for evaluation.  - Ambulatory referral to Podiatry  3. At risk for heart disease Ryan Hancock was given approximately 15 minutes of coronary artery disease prevention counseling  today. He is 60 y.o. male and has risk factors for heart disease including obesity. We discussed intensive lifestyle modifications today with an emphasis on specific weight loss instructions and strategies.   Repetitive spaced learning was employed today to elicit superior memory formation and behavioral change.  4. Obesity with current BMI of 37.4 Ryan Hancock is currently in the action stage of change. As such, his goal is to continue with weight loss efforts. He has agreed to the Category 4 Plan.   Behavioral modification strategies: increasing lean protein intake and decreasing simple carbohydrates.  Ryan Hancock has agreed to follow-up with our clinic in 2 to 3 weeks. He was informed of the importance of frequent follow-up visits to maximize his success with intensive lifestyle modifications for his multiple health conditions.   Objective:   Blood pressure 116/68, pulse 69, temperature 98.4 F (36.9 C), height 6' (1.829 m), weight 275 lb (124.7 kg), SpO2 97 %. Body mass index is 37.3 kg/m.  General: Cooperative, alert, well developed, in no acute distress. HEENT: Conjunctivae and lids unremarkable. Cardiovascular: Regular rhythm.  Lungs: Normal work of breathing. Neurologic: No focal deficits.   Lab Results  Component Value Date   CREATININE 0.87 04/22/2020   BUN 10 04/22/2020   NA 143 04/22/2020   K 4.5 04/22/2020   CL 104 04/22/2020   CO2 21 04/22/2020   Lab Results  Component Value Date   ALT 28 04/22/2020   AST 19 04/22/2020   ALKPHOS  83 04/22/2020   BILITOT 0.5 04/22/2020   Lab Results  Component Value Date   HGBA1C 6.5 (H) 04/22/2020   Lab Results  Component Value Date   INSULIN 16.0 04/22/2020   Lab Results  Component Value Date   TSH 3.190 04/22/2020   Lab Results  Component Value Date   CHOL 169 04/22/2020   HDL 48 04/22/2020   LDLCALC 97 04/22/2020   TRIG 137 04/22/2020   Lab Results  Component Value Date   WBC 8.6 04/22/2020   HGB 15.1 04/22/2020   HCT  44.0 04/22/2020   MCV 94 04/22/2020   PLT 309 04/22/2020   No results found for: IRON, TIBC, FERRITIN  Attestation Statements:   Reviewed by clinician on day of visit: allergies, medications, problem list, medical history, surgical history, family history, social history, and previous encounter notes.   I, Burt Knack, am acting as transcriptionist for Quillian Quince, MD.  I have reviewed the above documentation for accuracy and completeness, and I agree with the above. -  Quillian Quince, MD

## 2020-06-07 ENCOUNTER — Ambulatory Visit (INDEPENDENT_AMBULATORY_CARE_PROVIDER_SITE_OTHER): Payer: BC Managed Care – PPO

## 2020-06-07 ENCOUNTER — Ambulatory Visit: Payer: BC Managed Care – PPO | Admitting: Podiatry

## 2020-06-07 ENCOUNTER — Other Ambulatory Visit: Payer: Self-pay

## 2020-06-07 ENCOUNTER — Encounter: Payer: Self-pay | Admitting: Podiatry

## 2020-06-07 DIAGNOSIS — S96912A Strain of unspecified muscle and tendon at ankle and foot level, left foot, initial encounter: Secondary | ICD-10-CM | POA: Diagnosis not present

## 2020-06-07 DIAGNOSIS — M779 Enthesopathy, unspecified: Secondary | ICD-10-CM | POA: Diagnosis not present

## 2020-06-07 DIAGNOSIS — T753XXA Motion sickness, initial encounter: Secondary | ICD-10-CM | POA: Insufficient documentation

## 2020-06-07 DIAGNOSIS — R739 Hyperglycemia, unspecified: Secondary | ICD-10-CM | POA: Insufficient documentation

## 2020-06-07 DIAGNOSIS — Q666 Other congenital valgus deformities of feet: Secondary | ICD-10-CM

## 2020-06-07 DIAGNOSIS — M2031 Hallux varus (acquired), right foot: Secondary | ICD-10-CM

## 2020-06-07 NOTE — Patient Instructions (Addendum)
I have ordered an MRI of the LEFT ankle. If you do not hear for them about scheduling within the next 1 week, or you have any questions please give [place] a call at [phone].

## 2020-06-10 NOTE — Progress Notes (Signed)
Subjective:   Patient ID: Ryan Hancock, male   DOB: 60 y.o.   MRN: [id number]   HPI 60 year old male presents the office today with concerns of left foot discomfort.  He did have a injury in November 2021.  That time he saw orthopedics and had x-rays performed.  X-rays were negative and he brought in the report today.  He states he is continue to have swelling and discomfort mostly to the medial aspect of the foot and ankle.  He does get pain almost on daily basis.  He has it hurts more he walks and is on his foot.  Points the medial aspect where he gets the discomfort.  Also while he is in the office today he has noticed some discomfort on the right big toe behind the big toe joint.  He states that does not happen a lot about to have the area checked.  No injury on the right side.  He has no other concerns today.   Review of Systems  All other systems reviewed and are negative.  Past Medical History:  Diagnosis Date  . Anxiety   . Arthritis   . Depression   . Gallbladder problem   . Hip pain   . Hyperlipidemia   . Hypertension   . Hypothyroidism   . Joint pain   . Knee pain   . Other fatigue   . Prediabetes   . Shortness of breath on exertion   . Sleep apnea     Past Surgical History:  Procedure Laterality Date  . APPENDECTOMY  1994  . CHOLECYSTECTOMY  1996  . MENISCUS REPAIR  2002   right knee  . 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  buPROPion (WELLBUTRIN XL) 150 MG 24 hr tablet, Take 150 mg by mouth daily., Disp: , Rfl:  .  levothyroxine (SYNTHROID, LEVOTHROID) 50 MCG tablet, Take 50 mcg by mouth daily before breakfast., Disp: , Rfl:  .  lisinopril (PRINIVIL,ZESTRIL) 10 MG tablet, Take 10 mg by mouth daily., Disp: , Rfl:  .  LORazepam (ATIVAN) 0.5 MG tablet, Take 0.5 mg by  mouth as needed for anxiety. Take 1 tablet (0.5mg  total) by mouth as needed when flying., Disp: , Rfl:  .  pravastatin (PRAVACHOL) 40 MG tablet, Take 40 mg by mouth daily., Disp: , Rfl:  .  sertraline (ZOLOFT) 100 MG tablet, Take 100 mg by mouth daily. , Disp: , Rfl:  .  Vitamin D, Ergocalciferol, (DRISDOL) 1.25 MG (50000 UNIT) CAPS capsule, Take 1 capsule (50,000 Units total) by mouth every 7 (seven) days., Disp: 4 capsule, Rfl: 0  Allergies  Allergen Reactions  . Shellfish Allergy         Objective:  Physical Exam  General: AAO x3, NAD  Dermatological: Skin is warm, dry and supple bilateral. There are no open sores, no preulcerative lesions, no rash or signs of infection present.  Vascular: Dorsalis Pedis artery and Posterior Tibial artery pedal pulses are 2/4 bilateral with immedate capillary fill time.  There is no pain with calf compression, swelling, warmth, erythema.   Neruologic: Grossly intact via light touch bilateral.   Musculoskeletal: There is a decrease in medial arch upon weightbearing.  The majority tenderness today is on the distal portion of the posterior tibial tendon all incisions navicular tuberosity.  There is mild edema to the area.  Tendon appears to be clinically intact however due to the pain and swelling which has been ongoing about several months concerned about possible partial tearing.  Otherwise no other areas of discomfort identified in the left foot.  The right side upon weightbearing hallux varus is noted.  No pain with MPJ range of motion not able to elicit any area of pinpoint tenderness on the right side.  MMT 5/5.  Gait: Unassisted, Nonantalgic.       Assessment:   60 year old male with insertional posterior tibial tendon discomfort left side; right hallux varus     Plan:  -Treatment options discussed including all alternatives, risks, and complications -Etiology of symptoms were discussed -He present x-rays on left foot so we did not repeat  these today at his request.  I did review the report.  Given his ongoing discomfort and concern for possible partial tearing I ordered an MRI of the left ankle. -X-rays obtained and reviewed of the right foot.  Hallux varus is noted.  No evidence of acute fracture.  We discussed shoes and good arch support. -Discussed possible custom inserts, PT versus surgical intervention on the left but I like to wait for the MRI before proceeding with this.  Vivi Barrack DPM

## 2020-06-17 ENCOUNTER — Telehealth: Payer: Self-pay | Admitting: *Deleted

## 2020-06-17 NOTE — Telephone Encounter (Signed)
Patient has an appointment on 06-27-2020 at 8:30 for an MRI. Misty Stanley

## 2020-06-17 NOTE — Telephone Encounter (Signed)
-----   Message from Vivi Barrack, DPM sent at 06/10/2020  7:01 AM EDT ----- I ordered a MRI of the left ankle. Can you please follow up on this to see if it needs a precert? Thanks.

## 2020-06-25 ENCOUNTER — Encounter: Payer: Self-pay | Admitting: Podiatry

## 2020-06-25 NOTE — Progress Notes (Signed)
MRI approved, #[id number] valid 5/19-6/17/2022

## 2020-06-27 ENCOUNTER — Other Ambulatory Visit: Payer: BC Managed Care – PPO

## 2020-06-28 ENCOUNTER — Ambulatory Visit (INDEPENDENT_AMBULATORY_CARE_PROVIDER_SITE_OTHER): Payer: BC Managed Care – PPO | Admitting: Family Medicine

## 2020-07-07 ENCOUNTER — Ambulatory Visit (INDEPENDENT_AMBULATORY_CARE_PROVIDER_SITE_OTHER): Payer: BC Managed Care – PPO | Admitting: Family Medicine

## 2020-07-07 ENCOUNTER — Other Ambulatory Visit (INDEPENDENT_AMBULATORY_CARE_PROVIDER_SITE_OTHER): Payer: Self-pay | Admitting: Family Medicine

## 2020-07-07 ENCOUNTER — Other Ambulatory Visit: Payer: Self-pay

## 2020-07-07 ENCOUNTER — Encounter (INDEPENDENT_AMBULATORY_CARE_PROVIDER_SITE_OTHER): Payer: Self-pay | Admitting: Family Medicine

## 2020-07-07 VITALS — BP 132/79 | HR 75 | Temp 98.1°F | Ht 72.0 in | Wt 266.0 lb

## 2020-07-07 DIAGNOSIS — Z9189 Other specified personal risk factors, not elsewhere classified: Secondary | ICD-10-CM

## 2020-07-07 DIAGNOSIS — E559 Vitamin D deficiency, unspecified: Secondary | ICD-10-CM

## 2020-07-07 DIAGNOSIS — I10 Essential (primary) hypertension: Secondary | ICD-10-CM | POA: Diagnosis not present

## 2020-07-07 DIAGNOSIS — Z6838 Body mass index (BMI) 38.0-38.9, adult: Secondary | ICD-10-CM | POA: Diagnosis not present

## 2020-07-07 MED ORDER — VITAMIN D (ERGOCALCIFEROL) 1.25 MG (50000 UNIT) PO CAPS: 50000.0000 [IU] | ORAL_CAPSULE | ORAL | 0 refills | Status: DC

## 2020-07-13 NOTE — Progress Notes (Signed)
Chief Complaint:   OBESITY Ryan Hancock is here to discuss his progress with his obesity treatment plan along with follow-up of his obesity related diagnoses. Ryan Hancock is on the Category 4 Plan and states he is following his eating plan approximately 90% of the time. Ryan Hancock states he is doing 0 minutes 0 times per week.  Today's visit was #: 4 Starting weight: 284 lbs Starting date: 04/22/2020 Today's weight: 266 lbs Today's date: 07/07/2020 Total lbs lost to date: 18 Total lbs lost since last in-office visit: 9  Interim History: Ryan Hancock has done very well with weight loss even while on vacation. He still made better choices, and was mindful of his portions.  Subjective:   1. Vitamin D deficiency Ryan Hancock is stable on Vit D, and his level is slowly improving but is not yet at goal.  2. Essential hypertension Ryan Hancock's blood pressure is well controlled on her medications, and with diet and weight loss. She denies signs of hypotension.  3. At risk for heart disease Ryan Hancock is at a higher than average risk for cardiovascular disease due to obesity.   Assessment/Plan:   1. Vitamin D deficiency Low Vitamin D level contributes to fatigue and are associated with obesity, breast, and colon cancer. We will refill prescription Vitamin D for 1 month. We will recheck labs in 1-2 months. Ryan Hancock will follow-up for routine testing of Vitamin D, at least 2-3 times per year to avoid over-replacement.  - Vitamin D, Ergocalciferol, (DRISDOL) 1.25 MG (50000 UNIT) CAPS capsule; Take 1 capsule (50,000 Units total) by mouth every 7 (seven) days.  Dispense: 4 capsule; Refill: 0  2. Essential hypertension Ryan Hancock is at risk of low blood pressure with continued weight loss. We will continue to monitor and help avoid hypotension.   3. At risk for heart disease Ryan Hancock was given approximately 15 minutes of coronary artery disease prevention counseling today. He is 60 y.o. male and has risk factors for heart disease including obesity. We  discussed intensive lifestyle modifications today with an emphasis on specific weight loss instructions and strategies.   Repetitive spaced learning was employed today to elicit superior memory formation and behavioral change.  4. Obesity with current BMI 36.1 Ryan Hancock is currently in the action stage of change. As such, his goal is to continue with weight loss efforts. He has agreed to the Category 4 Plan.   Behavioral modification strategies: increasing lean protein intake and decreasing simple carbohydrates.  Ryan Hancock has agreed to follow-up with our clinic in 3 weeks. He was informed of the importance of frequent follow-up visits to maximize his success with intensive lifestyle modifications for his multiple health conditions.   Objective:   Blood pressure 132/79, pulse 75, temperature 98.1 F (36.7 C), height 6' (1.829 m), weight 266 lb (120.7 kg), SpO2 97 %. Body mass index is 36.08 kg/m.  General: Cooperative, alert, well developed, in no acute distress. HEENT: Conjunctivae and lids unremarkable. Cardiovascular: Regular rhythm.  Lungs: Normal work of breathing. Neurologic: No focal deficits.   Lab Results  Component Value Date   CREATININE 0.87 04/22/2020   BUN 10 04/22/2020   NA 143 04/22/2020   K 4.5 04/22/2020   CL 104 04/22/2020   CO2 21 04/22/2020   Lab Results  Component Value Date   ALT 28 04/22/2020   AST 19 04/22/2020   ALKPHOS 83 04/22/2020   BILITOT 0.5 04/22/2020   Lab Results  Component Value Date   HGBA1C 6.5 (H) 04/22/2020   Lab Results  Component Value Date   INSULIN 16.0 04/22/2020   Lab Results  Component Value Date   TSH 3.190 04/22/2020   Lab Results  Component Value Date   CHOL 169 04/22/2020   HDL 48 04/22/2020   LDLCALC 97 04/22/2020   TRIG 137 04/22/2020   Lab Results  Component Value Date   WBC 8.6 04/22/2020   HGB 15.1 04/22/2020   HCT 44.0 04/22/2020   MCV 94 04/22/2020   PLT 309 04/22/2020   No results found for: IRON,  TIBC, FERRITIN  Attestation Statements:   Reviewed by clinician on day of visit: allergies, medications, problem list, medical history, surgical history, family history, social history, and previous encounter notes.   I, Burt Knack, am acting as transcriptionist for Quillian Quince, MD.  I have reviewed the above documentation for accuracy and completeness, and I agree with the above. -  Quillian Quince, MD

## 2020-07-14 ENCOUNTER — Other Ambulatory Visit: Payer: Self-pay | Admitting: General Surgery

## 2020-07-14 DIAGNOSIS — K610 Anal abscess: Secondary | ICD-10-CM

## 2020-07-19 ENCOUNTER — Ambulatory Visit (INDEPENDENT_AMBULATORY_CARE_PROVIDER_SITE_OTHER): Payer: BC Managed Care – PPO | Admitting: Adult Health

## 2020-08-02 ENCOUNTER — Encounter (INDEPENDENT_AMBULATORY_CARE_PROVIDER_SITE_OTHER): Payer: Self-pay | Admitting: Family Medicine

## 2020-08-02 ENCOUNTER — Ambulatory Visit (INDEPENDENT_AMBULATORY_CARE_PROVIDER_SITE_OTHER): Payer: BC Managed Care – PPO | Admitting: Family Medicine

## 2020-08-02 ENCOUNTER — Other Ambulatory Visit: Payer: Self-pay

## 2020-08-02 VITALS — BP 127/80 | HR 71 | Temp 97.8°F | Ht 72.0 in | Wt 262.0 lb

## 2020-08-02 DIAGNOSIS — I10 Essential (primary) hypertension: Secondary | ICD-10-CM

## 2020-08-02 DIAGNOSIS — Z9189 Other specified personal risk factors, not elsewhere classified: Secondary | ICD-10-CM

## 2020-08-02 DIAGNOSIS — Z6838 Body mass index (BMI) 38.0-38.9, adult: Secondary | ICD-10-CM

## 2020-08-02 DIAGNOSIS — E559 Vitamin D deficiency, unspecified: Secondary | ICD-10-CM

## 2020-08-02 MED ORDER — VITAMIN D (ERGOCALCIFEROL) 1.25 MG (50000 UNIT) PO CAPS: 50000.0000 [IU] | ORAL_CAPSULE | ORAL | 0 refills | Status: DC

## 2020-08-07 ENCOUNTER — Other Ambulatory Visit: Payer: Self-pay

## 2020-08-07 ENCOUNTER — Ambulatory Visit
Admission: RE | Admit: 2020-08-07 | Discharge: 2020-08-07 | Disposition: A | Discharge status: Home / Self Care | Payer: BC Managed Care – PPO | Source: Ambulatory Visit | Attending: General Surgery | Admitting: General Surgery

## 2020-08-07 DIAGNOSIS — K610 Anal abscess: Secondary | ICD-10-CM

## 2020-08-07 MED ORDER — GADOBENATE DIMEGLUMINE 529 MG/ML IV SOLN
20.0000 mL | Freq: Once | INTRAVENOUS | Status: AC | PRN
  Administered 2020-08-07: 20 mL via INTRAVENOUS

## 2020-08-10 NOTE — Progress Notes (Signed)
Chief Complaint:   OBESITY Ryan Hancock is here to discuss his progress with his obesity treatment plan along with follow-up of his obesity related diagnoses. Ryan Hancock is on the Category 4 Plan and states he is following his eating plan approximately 85% of the time. Ryan Hancock states he is doing light weights for 20-30 minutes 3-5 times per week.  Today's visit was #: 5 Starting weight: 284 lbs Starting date: 04/22/2020 Today's weight: 262 lbs Today's date: 08/02/2020 Total lbs lost to date: 22 Total lbs lost since last in-office visit: 4  Interim History: Ryan Hancock continues to do well with weight loss. His hunger is controlled and he is doing well with eating all of his food. He has some carbohydrates cravings, but he was not out of control even over Father's Day.  Subjective:   1. Vitamin D deficiency Ryan Hancock is on Vit D, and he is due for labs soon.  2. Essential hypertension Ryan Hancock's blood pressure is controlled with his medications, diet, and weight loss. No hypotension or chest pain was noted.  3. At risk for complication associated with hypotension Ryan Hancock is at a higher than average risk of hypotension due to continued weight loss.  Assessment/Plan:   1. Vitamin D deficiency Low Vitamin D level contributes to fatigue and are associated with obesity, breast, and colon cancer. We will refill prescription Vitamin D for 1 month. We will recheck labs in 3-4 weeks, and Ryan Hancock will follow-up for routine testing of Vitamin D, at least 2-3 times per year to avoid over-replacement.  - Vitamin D, Ergocalciferol, (DRISDOL) 1.25 MG (50000 UNIT) CAPS capsule; Take 1 capsule (50,000 Units total) by mouth every 7 (seven) days.  Dispense: 4 capsule; Refill: 0  2. Essential hypertension Ryan Hancock will continue diet, exercise, and his medications, with the goal to control his blood pressure with less or no medications. We will watch for signs of hypotension as he continues his lifestyle modifications.  3. At risk for  complication associated with hypotension Ryan Hancock was given approximately 15 minutes of education and counseling today to help avoid hypotension. We discussed risks of hypotension with weight loss and signs of hypotension such as feeling lightheaded or unsteady.  Repetitive spaced learning was employed today to elicit superior memory formation and behavioral change.  4. Obesity with current BMI 35.6 Ryan Hancock is currently in the action stage of change. As such, his goal is to continue with weight loss efforts. He has agreed to the Category 4 Plan.   We will recheck fasting labs at his next visit.  Exercise goals: As is.  Behavioral modification strategies: increasing lean protein intake and increasing vegetables.  Ryan Hancock has agreed to follow-up with our clinic in 3 to 4 weeks. He was informed of the importance of frequent follow-up visits to maximize his success with intensive lifestyle modifications for his multiple health conditions.   Objective:   Blood pressure 127/80, pulse 71, temperature 97.8 F (36.6 C), height 6' (1.829 m), weight 262 lb (118.8 kg), SpO2 97 %. Body mass index is 35.53 kg/m.  General: Cooperative, alert, well developed, in no acute distress. HEENT: Conjunctivae and lids unremarkable. Cardiovascular: Regular rhythm.  Lungs: Normal work of breathing. Neurologic: No focal deficits.   Lab Results  Component Value Date   CREATININE 0.87 04/22/2020   BUN 10 04/22/2020   NA 143 04/22/2020   K 4.5 04/22/2020   CL 104 04/22/2020   CO2 21 04/22/2020   Lab Results  Component Value Date   ALT 28  04/22/2020   AST 19 04/22/2020   ALKPHOS 83 04/22/2020   BILITOT 0.5 04/22/2020   Lab Results  Component Value Date   HGBA1C 6.5 (H) 04/22/2020   Lab Results  Component Value Date   INSULIN 16.0 04/22/2020   Lab Results  Component Value Date   TSH 3.190 04/22/2020   Lab Results  Component Value Date   CHOL 169 04/22/2020   HDL 48 04/22/2020   LDLCALC 97  04/22/2020   TRIG 137 04/22/2020   Lab Results  Component Value Date   VD25OH 41.9 04/22/2020   Lab Results  Component Value Date   WBC 8.6 04/22/2020   HGB 15.1 04/22/2020   HCT 44.0 04/22/2020   MCV 94 04/22/2020   PLT 309 04/22/2020   No results found for: IRON, TIBC, FERRITIN  Attestation Statements:   Reviewed by clinician on day of visit: allergies, medications, problem list, medical history, surgical history, family history, social history, and previous encounter notes.   I, Burt Knack, am acting as transcriptionist for Quillian Quince, MD.  I have reviewed the above documentation for accuracy and completeness, and I agree with the above. -  Quillian Quince, MD

## 2020-08-19 DIAGNOSIS — K604 Rectal fistula: Secondary | ICD-10-CM | POA: Diagnosis not present

## 2020-08-19 DIAGNOSIS — I10 Essential (primary) hypertension: Secondary | ICD-10-CM | POA: Diagnosis not present

## 2020-08-19 DIAGNOSIS — E119 Type 2 diabetes mellitus without complications: Secondary | ICD-10-CM | POA: Diagnosis not present

## 2020-08-19 DIAGNOSIS — T753XXA Motion sickness, initial encounter: Secondary | ICD-10-CM | POA: Diagnosis not present

## 2020-08-25 ENCOUNTER — Ambulatory Visit (INDEPENDENT_AMBULATORY_CARE_PROVIDER_SITE_OTHER): Payer: BC Managed Care – PPO | Admitting: Physician Assistant

## 2020-08-30 ENCOUNTER — Other Ambulatory Visit (INDEPENDENT_AMBULATORY_CARE_PROVIDER_SITE_OTHER): Payer: Self-pay | Admitting: Family Medicine

## 2020-08-30 ENCOUNTER — Ambulatory Visit (INDEPENDENT_AMBULATORY_CARE_PROVIDER_SITE_OTHER): Payer: BC Managed Care – PPO | Admitting: Family Medicine

## 2020-08-30 ENCOUNTER — Encounter (INDEPENDENT_AMBULATORY_CARE_PROVIDER_SITE_OTHER): Payer: Self-pay | Admitting: Family Medicine

## 2020-08-30 ENCOUNTER — Other Ambulatory Visit: Payer: Self-pay

## 2020-08-30 VITALS — BP 114/68 | HR 70 | Temp 98.1°F | Ht 72.0 in | Wt 257.0 lb

## 2020-08-30 DIAGNOSIS — Z9189 Other specified personal risk factors, not elsewhere classified: Secondary | ICD-10-CM | POA: Diagnosis not present

## 2020-08-30 DIAGNOSIS — E559 Vitamin D deficiency, unspecified: Secondary | ICD-10-CM

## 2020-08-30 DIAGNOSIS — E039 Hypothyroidism, unspecified: Secondary | ICD-10-CM | POA: Diagnosis not present

## 2020-08-30 DIAGNOSIS — Z6838 Body mass index (BMI) 38.0-38.9, adult: Secondary | ICD-10-CM

## 2020-08-30 DIAGNOSIS — E1169 Type 2 diabetes mellitus with other specified complication: Secondary | ICD-10-CM

## 2020-08-30 DIAGNOSIS — E66812 Body mass index (BMI) 38.0-38.9, adult: Secondary | ICD-10-CM

## 2020-08-30 MED ORDER — VITAMIN D (ERGOCALCIFEROL) 1.25 MG (50000 UNIT) PO CAPS: 50000.0000 [IU] | ORAL_CAPSULE | ORAL | 0 refills | Status: DC

## 2020-08-31 LAB — INSULIN, RANDOM: INSULIN: 12.5 u[IU]/mL (ref 2.6–24.9)

## 2020-08-31 LAB — COMPREHENSIVE METABOLIC PANEL
ALT: 30 IU/L (ref 0–44)
AST: 21 IU/L (ref 0–40)
Albumin/Globulin Ratio: 1.9 (ref 1.2–2.2)
Albumin: 4.7 g/dL (ref 3.8–4.9)
Alkaline Phosphatase: 79 IU/L (ref 44–121)
BUN/Creatinine Ratio: 27 — ABNORMAL HIGH (ref 10–24)
BUN: 21 mg/dL (ref 8–27)
Bilirubin Total: 0.5 mg/dL (ref 0.0–1.2)
CO2: 23 mmol/L (ref 20–29)
Calcium: 9.8 mg/dL (ref 8.6–10.2)
Chloride: 102 mmol/L (ref 96–106)
Creatinine, Ser: 0.79 mg/dL (ref 0.76–1.27)
Globulin, Total: 2.5 g/dL (ref 1.5–4.5)
Glucose: 108 mg/dL — ABNORMAL HIGH (ref 65–99)
Potassium: 4.8 mmol/L (ref 3.5–5.2)
Sodium: 140 mmol/L (ref 134–144)
Total Protein: 7.2 g/dL (ref 6.0–8.5)
eGFR: 102 mL/min/{1.73_m2} (ref >59)

## 2020-08-31 LAB — HEMOGLOBIN A1C
Est. average glucose Bld gHb Est-mCnc: 123 mg/dL
Hgb A1c MFr Bld: 5.9 % — ABNORMAL HIGH (ref 4.8–5.6)

## 2020-08-31 LAB — TSH: TSH: 2.3 u[IU]/mL (ref 0.450–4.500)

## 2020-08-31 LAB — VITAMIN D 25 HYDROXY (VIT D DEFICIENCY, FRACTURES): Vit D, 25-Hydroxy: 73.4 ng/mL (ref 30.0–100.0)

## 2020-09-02 DIAGNOSIS — Z0001 Encounter for general adult medical examination with abnormal findings: Secondary | ICD-10-CM | POA: Diagnosis not present

## 2020-09-02 DIAGNOSIS — Z125 Encounter for screening for malignant neoplasm of prostate: Secondary | ICD-10-CM | POA: Diagnosis not present

## 2020-09-02 DIAGNOSIS — I10 Essential (primary) hypertension: Secondary | ICD-10-CM | POA: Diagnosis not present

## 2020-09-02 DIAGNOSIS — E781 Pure hyperglyceridemia: Secondary | ICD-10-CM | POA: Diagnosis not present

## 2020-09-02 DIAGNOSIS — E039 Hypothyroidism, unspecified: Secondary | ICD-10-CM | POA: Diagnosis not present

## 2020-09-02 DIAGNOSIS — G4733 Obstructive sleep apnea (adult) (pediatric): Secondary | ICD-10-CM | POA: Diagnosis not present

## 2020-09-02 DIAGNOSIS — N400 Enlarged prostate without lower urinary tract symptoms: Secondary | ICD-10-CM | POA: Diagnosis not present

## 2020-09-02 NOTE — Progress Notes (Signed)
Chief Complaint:   OBESITY Ryan Hancock is here to discuss his progress with his obesity treatment plan along with follow-up of his obesity related diagnoses. Ryan Hancock is on the Category 4 Plan and states he is following his eating plan approximately 85-90% of the time. Ryan Hancock states he is walking for 20-30 minutes 3 times per week.  Today's visit was #: 6 Starting weight: 284 lbs Starting date: 04/22/2020 Today's weight: 257 lbs Today's date: 08/30/2020 Total lbs lost to date: 27 lbs Total lbs lost since last in-office visit: 5 lbs  Interim History: Ryan Hancock says is is quite easy to get in the prescribed protein. He likes the plan and feels it's working well for him. He sometimes splurges on alcohol. He traveled last week for work which makes adhering to plan a bit more difficult. He will be traveling for a wedding in a few weeks.  Subjective:   1. Type 2 diabetes mellitus with other specified complication, without long-term current use of insulin (HCC) Ryan Hancock last A1C was within goal (6.5). He has never been on medication. He notes some hypoglycemia which he has had his whole life.   Lab Results  Component Value Date   HGBA1C 5.9 (H) 08/30/2020   HGBA1C 6.5 (H) 04/22/2020   Lab Results  Component Value Date   LDLCALC 97 04/22/2020   CREATININE 0.79 08/30/2020   Lab Results  Component Value Date   INSULIN 12.5 08/30/2020   INSULIN 16.0 04/22/2020    2. Vitamin D deficiency Ryan Hancock last Vitamin D was low at 41.9 on weekly.  Lab Results  Component Value Date   VD25OH 73.4 08/30/2020   VD25OH 41.9 04/22/2020    3. Hypothyroidism, unspecified type Ryan Hancock started 50 mcg of Levothyroxine daily. He feels well at this dose.  Lab Results  Component Value Date   TSH 2.300 08/30/2020     4. At risk for impaired metabolic function Ryan Hancock is at risk for impaired metabolic function due to loss of 10 lbs muscle mass since start of program per [employer].  Assessment/Plan:   1. Type 2  diabetes mellitus with other specified complication, without long-term current use of insulin (HCC) Ryan Hancock will continue exercise and meal plan. We will check labs today.- Comprehensive metabolic panel - Hemoglobin A1c - Insulin, random   He agrees to continue to take prescription Vitamin D 50,000 IU every week. We will refill Vitamin D today for 1 month with no refills. We will check labs today. He will follow-up for routine testing of Vitamin D, at least 2-3 times per year to avoid over-replacement.  - VITAMIN D 25 Hydroxy (Vit-D Deficiency, Fractures) - Vitamin D, Ergocalciferol, (DRISDOL) 1.25 MG (50000 UNIT) CAPS capsule; Take 1 capsule (50,000 Units total) by mouth every 7 (seven) days.  Dispense: 12 capsule; Refill: 0  3. Hypothyroidism, unspecified type Ryan Hancock will continue 50 mcg Levothyroxine. We will check TSH today.   4. At risk for impaired metabolic function Ryan Hancock was given approximately 15 minutes of impaired  metabolic function prevention counseling today. We discussed intensive lifestyle modifications today with an emphasis on specific nutrition and exercise instructions and strategies.   Repetitive spaced learning was employed today to elicit superior memory formation and behavioral change.   5. Obesity with current BMI 34.85 Ryan Hancock is currently in the action stage of change. As such, his goal is to continue with weight loss efforts. He has agreed to the Category 4 Plan and keeping a food journal and adhering to recommended goals  of 550-700 calories and 45 of grams protein.   Exercise goals:  Ryan Hancock will add resistance training 2 times a week. Continue cardio.  Behavioral modification strategies: decreasing liquid calories, travel eating strategies, and keeping a strict food journal.  Ryan Hancock has agreed to follow-up with our clinic in 3 weeks with Dr. Dalbert Garnet. Objective:   Blood pressure 114/68, pulse 70, temperature 98.1 F (36.7 C), height 6' (1.829 m), weight 257 lb (116.6 kg),  SpO2 98 %. Body mass index is 34.86 kg/m.  General: Cooperative, alert, well developed, in no acute distress. HEENT: Conjunctivae and lids unremarkable. Cardiovascular: Regular rhythm.  Lungs: Normal work of breathing. Neurologic: No focal deficits.   Lab Results  Component Value Date   CREATININE 0.79 08/30/2020   BUN 21 08/30/2020   NA 140 08/30/2020   K 4.8 08/30/2020   CL 102 08/30/2020   CO2 23 08/30/2020   Lab Results  Component Value Date   ALT 30 08/30/2020   AST 21 08/30/2020   ALKPHOS 79 08/30/2020   BILITOT 0.5 08/30/2020   Lab Results  Component Value Date   HGBA1C 5.9 (H) 08/30/2020   HGBA1C 6.5 (H) 04/22/2020   Lab Results  Component Value Date   INSULIN 12.5 08/30/2020   INSULIN 16.0 04/22/2020   Lab Results  Component Value Date   TSH 2.300 08/30/2020   Lab Results  Component Value Date   CHOL 169 04/22/2020   HDL 48 04/22/2020   LDLCALC 97 04/22/2020   TRIG 137 04/22/2020   Lab Results  Component Value Date   VD25OH 73.4 08/30/2020   VD25OH 41.9 04/22/2020   Lab Results  Component Value Date   WBC 8.6 04/22/2020   HGB 15.1 04/22/2020   HCT 44.0 04/22/2020   MCV 94 04/22/2020   PLT 309 04/22/2020   No results found for: IRON, TIBC, FERRITIN  Attestation Statements:   Reviewed by clinician on day of visit: allergies, medications, problem list, medical history, surgical history, family history, social history, and previous encounter notes.  I, Jackson Latino, RMA, am acting as Energy manager for [place], FNP.   I have reviewed the above documentation for accuracy and completeness, and I agree with the above. -  Jesse Sans, FNP

## 2020-09-04 DIAGNOSIS — E039 Hypothyroidism, unspecified: Secondary | ICD-10-CM | POA: Insufficient documentation

## 2020-09-04 DIAGNOSIS — E559 Vitamin D deficiency, unspecified: Secondary | ICD-10-CM | POA: Insufficient documentation

## 2020-09-04 DIAGNOSIS — E119 Type 2 diabetes mellitus without complications: Secondary | ICD-10-CM | POA: Insufficient documentation

## 2020-09-09 DIAGNOSIS — K603 Anal fistula: Secondary | ICD-10-CM | POA: Diagnosis not present

## 2020-09-27 ENCOUNTER — Encounter (INDEPENDENT_AMBULATORY_CARE_PROVIDER_SITE_OTHER): Payer: Self-pay | Admitting: Family Medicine

## 2020-09-27 ENCOUNTER — Other Ambulatory Visit: Payer: Self-pay

## 2020-09-27 ENCOUNTER — Encounter (INDEPENDENT_AMBULATORY_CARE_PROVIDER_SITE_OTHER): Payer: Self-pay

## 2020-09-27 ENCOUNTER — Ambulatory Visit (INDEPENDENT_AMBULATORY_CARE_PROVIDER_SITE_OTHER): Payer: BC Managed Care – PPO | Admitting: Family Medicine

## 2020-09-27 VITALS — BP 140/79 | HR 63 | Temp 97.7°F | Ht 72.0 in | Wt 256.0 lb

## 2020-09-27 DIAGNOSIS — E038 Other specified hypothyroidism: Secondary | ICD-10-CM

## 2020-09-27 DIAGNOSIS — Z6838 Body mass index (BMI) 38.0-38.9, adult: Secondary | ICD-10-CM

## 2020-09-27 DIAGNOSIS — E559 Vitamin D deficiency, unspecified: Secondary | ICD-10-CM | POA: Diagnosis not present

## 2020-09-27 DIAGNOSIS — Z9189 Other specified personal risk factors, not elsewhere classified: Secondary | ICD-10-CM | POA: Diagnosis not present

## 2020-09-27 MED ORDER — VITAMIN D (ERGOCALCIFEROL) 1.25 MG (50000 UNIT) PO CAPS: 50000.0000 [IU] | ORAL_CAPSULE | ORAL | 0 refills | Status: DC

## 2020-09-28 NOTE — Progress Notes (Signed)
Chief Complaint:   OBESITY Ryan Hancock is here to discuss his progress with his obesity treatment plan along with follow-up of his obesity related diagnoses. Ryan Hancock is on the Category 4 Plan and keeping a food journal and adhering to recommended goals of 550-700 calories and 45 grams of protein at supper daily and states he is following his eating plan approximately 70% of the time. Ryan Hancock states he is doing 0 minutes 0 times per week.  Today's visit was #: 7 Starting weight: 284 lbs Starting date: 04/22/2020 Today's weight: 256 lbs Today's date: 09/27/2020 Total lbs lost to date: 28 Total lbs lost since last in-office visit: 1  Interim History: Ryan Hancock has been on vacation and he did some celebration eating. He was active though and tried to portion control. He is getting ready to restart exercise, but his hip has been hurting so he will get this checked out first.  Subjective:   1. Vitamin D deficiency Ryan Hancock is stable on Vit D, and he denies nausea, vomiting, or muscle weakness.  2. Other specified hypothyroidism Ryan Hancock had his Synthroid adjusted from 50 mcg to 75 mcg recently based on his labs results. He is followed by his primary care physician. He denies tremors or palpitations.  3. At risk for heart disease Ryan Hancock is at a higher than average risk for cardiovascular disease due to obesity.   Assessment/Plan:   1. Vitamin D deficiency Low Vitamin D level contributes to fatigue and are associated with obesity, breast, and colon cancer. We will refill prescription Vitamin D for 90 days with no refills. Ryan Hancock will follow-up for routine testing of Vitamin D, at least 2-3 times per year to avoid over-replacement.  - Vitamin D, Ergocalciferol, (DRISDOL) 1.25 MG (50000 UNIT) CAPS capsule; Take 1 capsule (50,000 Units total) by mouth every 7 (seven) days.  Dispense: 12 capsule; Refill: 0  2. Other specified hypothyroidism Ryan Hancock is to make sure to get a follow up lab after 6 weeks or when his primary  care physician reccommends. Orders and follow up as documented in patient record.  3. At risk for heart disease Ryan Hancock was given approximately 15 minutes of coronary artery disease prevention counseling today. He is 60 y.o. male and has risk factors for heart disease including obesity. We discussed intensive lifestyle modifications today with an emphasis on specific weight loss instructions and strategies.   Repetitive spaced learning was employed today to elicit superior memory formation and behavioral change.  4. Obesity with current BMI 34.7 Ryan Hancock is currently in the action stage of change. As such, his goal is to continue with weight loss efforts. He has agreed to the Category 4 Plan.   Behavioral modification strategies: increasing lean protein intake.  Ryan Hancock has agreed to follow-up with our clinic in 3 to 4 weeks. He was informed of the importance of frequent follow-up visits to maximize his success with intensive lifestyle modifications for his multiple health conditions.   Objective:   Blood pressure 140/79, pulse 63, temperature 97.7 F (36.5 C), height 6' (1.829 m), weight 256 lb (116.1 kg), SpO2 99 %. Body mass index is 34.72 kg/m.  General: Cooperative, alert, well developed, in no acute distress. HEENT: Conjunctivae and lids unremarkable. Cardiovascular: Regular rhythm.  Lungs: Normal work of breathing. Neurologic: No focal deficits.   Lab Results  Component Value Date   CREATININE 0.79 08/30/2020   BUN 21 08/30/2020   NA 140 08/30/2020   K 4.8 08/30/2020   CL 102 08/30/2020  CO2 23 08/30/2020   Lab Results  Component Value Date   ALT 30 08/30/2020   AST 21 08/30/2020   ALKPHOS 79 08/30/2020   BILITOT 0.5 08/30/2020   Lab Results  Component Value Date   HGBA1C 5.9 (H) 08/30/2020   HGBA1C 6.5 (H) 04/22/2020   Lab Results  Component Value Date   INSULIN 12.5 08/30/2020   INSULIN 16.0 04/22/2020   Lab Results  Component Value Date   TSH 2.300 08/30/2020    Lab Results  Component Value Date   CHOL 169 04/22/2020   HDL 48 04/22/2020   LDLCALC 97 04/22/2020   TRIG 137 04/22/2020   Lab Results  Component Value Date   VD25OH 73.4 08/30/2020   VD25OH 41.9 04/22/2020   Lab Results  Component Value Date   WBC 8.6 04/22/2020   HGB 15.1 04/22/2020   HCT 44.0 04/22/2020   MCV 94 04/22/2020   PLT 309 04/22/2020   No results found for: IRON, TIBC, FERRITIN  Attestation Statements:   Reviewed by clinician on day of visit: allergies, medications, problem list, medical history, surgical history, family history, social history, and previous encounter notes.    I, Burt Knack, am acting as transcriptionist for Quillian Quince, MD.  I have reviewed the above documentation for accuracy and completeness, and I agree with the above. -  Quillian Quince, MD

## 2020-10-06 DIAGNOSIS — M25551 Pain in right hip: Secondary | ICD-10-CM | POA: Diagnosis not present

## 2020-10-06 DIAGNOSIS — M1611 Unilateral primary osteoarthritis, right hip: Secondary | ICD-10-CM | POA: Diagnosis not present

## 2020-10-06 DIAGNOSIS — Z96642 Presence of left artificial hip joint: Secondary | ICD-10-CM | POA: Diagnosis not present

## 2020-10-18 ENCOUNTER — Ambulatory Visit (INDEPENDENT_AMBULATORY_CARE_PROVIDER_SITE_OTHER): Payer: BC Managed Care – PPO | Admitting: Family Medicine

## 2020-10-18 ENCOUNTER — Encounter (INDEPENDENT_AMBULATORY_CARE_PROVIDER_SITE_OTHER): Payer: Self-pay | Admitting: Family Medicine

## 2020-10-18 ENCOUNTER — Other Ambulatory Visit: Payer: Self-pay

## 2020-10-18 VITALS — BP 123/72 | Temp 97.7°F | Ht 72.0 in | Wt 255.0 lb

## 2020-10-18 DIAGNOSIS — Z6838 Body mass index (BMI) 38.0-38.9, adult: Secondary | ICD-10-CM | POA: Diagnosis not present

## 2020-10-18 DIAGNOSIS — F3289 Other specified depressive episodes: Secondary | ICD-10-CM

## 2020-10-19 NOTE — Progress Notes (Signed)
Chief Complaint:   OBESITY Ryan Hancock is here to discuss his progress with his obesity treatment plan along with follow-up of his obesity related diagnoses. Ryan Hancock is on the Category 4 Plan and states he is following his eating plan approximately 80% of the time. Ryan Hancock states he is walking for 25 minutes 4 times per week.  Today's visit was #: 8 Starting weight: 284 lbs Starting date: 04/22/2020 Today's weight: 255 lbs Today's date: 10/18/2020 Total lbs lost to date: 29 Total lbs lost since last in-office visit: 1  Interim History: Ryan Hancock is down 1 lb but this is water weight. He has struggled a bit more with indulging in the evenings but he is working on this.  Subjective:   1. Other depression with emotional eating Ryan Hancock notes using food and drinks as a reward, especially on the weekends. He is stable on Wellbutrin, with no elevation in blood pressure.  Assessment/Plan:   1. Other depression with emotional eating Emotional eating behavior strategies were discussed today to help Ryan Hancock cut down, but not feel deprived. Will continue to monitor. Orders and follow up as documented in patient record.   2. Obesity with current BMI 34.6 Ryan Hancock is currently in the action stage of change. As such, his goal is to continue with weight loss efforts. He has agreed to the Category 4 Plan and keeping a food journal and adhering to recommended goals of 550-700 calories and 45+ grams of protein at supper daily.   Exercise goals: As is.  Behavioral modification strategies: increasing lean protein intake, meal planning and cooking strategies, and emotional eating strategies.  Ryan Hancock has agreed to follow-up with our clinic in 2 to 3 weeks. He was informed of the importance of frequent follow-up visits to maximize his success with intensive lifestyle modifications for his multiple health conditions.   Objective:   Blood pressure 123/72, temperature 97.7 F (36.5 C), height 6' (1.829 m), weight 255 lb (115.7  kg). Body mass index is 34.58 kg/m.  General: Cooperative, alert, well developed, in no acute distress. HEENT: Conjunctivae and lids unremarkable. Cardiovascular: Regular rhythm.  Lungs: Normal work of breathing. Neurologic: No focal deficits.   Lab Results  Component Value Date   CREATININE 0.79 08/30/2020   BUN 21 08/30/2020   NA 140 08/30/2020   K 4.8 08/30/2020   CL 102 08/30/2020   CO2 23 08/30/2020   Lab Results  Component Value Date   ALT 30 08/30/2020   AST 21 08/30/2020   ALKPHOS 79 08/30/2020   BILITOT 0.5 08/30/2020   Lab Results  Component Value Date   HGBA1C 5.9 (H) 08/30/2020   HGBA1C 6.5 (H) 04/22/2020   Lab Results  Component Value Date   INSULIN 12.5 08/30/2020   INSULIN 16.0 04/22/2020   Lab Results  Component Value Date   TSH 2.300 08/30/2020   Lab Results  Component Value Date   CHOL 169 04/22/2020   HDL 48 04/22/2020   LDLCALC 97 04/22/2020   TRIG 137 04/22/2020   Lab Results  Component Value Date   VD25OH 73.4 08/30/2020   VD25OH 41.9 04/22/2020   Lab Results  Component Value Date   WBC 8.6 04/22/2020   HGB 15.1 04/22/2020   HCT 44.0 04/22/2020   MCV 94 04/22/2020   PLT 309 04/22/2020   No results found for: IRON, TIBC, FERRITIN  Attestation Statements:   Reviewed by clinician on day of visit: allergies, medications, problem list, medical history, surgical history, family history, social history,  and previous encounter notes.  Time spent on visit including pre-visit chart review and post-visit care and charting was 42 minutes.    I, Burt Knack, am acting as transcriptionist for Quillian Quince, MD.  I have reviewed the above documentation for accuracy and completeness, and I agree with the above. -  Quillian Quince, MD

## 2020-11-03 ENCOUNTER — Encounter (INDEPENDENT_AMBULATORY_CARE_PROVIDER_SITE_OTHER): Payer: Self-pay

## 2020-11-04 ENCOUNTER — Ambulatory Visit (INDEPENDENT_AMBULATORY_CARE_PROVIDER_SITE_OTHER): Payer: BC Managed Care – PPO | Admitting: Family Medicine

## 2020-11-04 ENCOUNTER — Other Ambulatory Visit: Payer: Self-pay

## 2020-11-04 ENCOUNTER — Encounter (INDEPENDENT_AMBULATORY_CARE_PROVIDER_SITE_OTHER): Payer: Self-pay | Admitting: Family Medicine

## 2020-11-04 VITALS — BP 110/71 | HR 69 | Temp 98.1°F | Ht 72.0 in | Wt 252.0 lb

## 2020-11-04 DIAGNOSIS — Z6838 Body mass index (BMI) 38.0-38.9, adult: Secondary | ICD-10-CM | POA: Diagnosis not present

## 2020-11-04 DIAGNOSIS — Z9189 Other specified personal risk factors, not elsewhere classified: Secondary | ICD-10-CM

## 2020-11-04 DIAGNOSIS — E559 Vitamin D deficiency, unspecified: Secondary | ICD-10-CM | POA: Diagnosis not present

## 2020-11-04 DIAGNOSIS — I10 Essential (primary) hypertension: Secondary | ICD-10-CM | POA: Diagnosis not present

## 2020-11-04 MED ORDER — VITAMIN D (ERGOCALCIFEROL) 1.25 MG (50000 UNIT) PO CAPS: 50000.0000 [IU] | ORAL_CAPSULE | ORAL | 0 refills | Status: DC

## 2020-11-04 NOTE — Progress Notes (Signed)
Chief Complaint:   OBESITY Ryan Hancock is here to discuss his progress with his obesity treatment plan along with follow-up of his obesity related diagnoses. Ryan Hancock is on the Category 4 Plan and keeping a food journal and adhering to recommended goals of 550-700 calories and 45+ grams of protein at supper daily and states he is following his eating plan approximately 85-90% of the time. Ryan Hancock states he is walking for 30 minutes 3-4 times per week.  Today's visit was #: 9 Starting weight: 284 lbs Starting date: 04/22/2020 Today's weight: 252 lbs Today's date: 11/04/2020 Total lbs lost to date: 32 Total lbs lost since last in-office visit: 3  Interim History: Ryan Hancock continues to do well with weight loss. He is doing well with portion control and making strategies to do better with football season snacks.  Subjective:   1. Vitamin D deficiency Ryan Hancock's Vit D level is at goal. He is at high risk of over-replacement especially with continued weight loss.  2. Essential hypertension Ryan Hancock's blood pressure is well controlled with his medications, diet, and weight loss. No side effects were noted.  3. At risk for heart disease Ryan Hancock is at a higher than average risk for cardiovascular disease due to obesity.   Assessment/Plan:   1. Vitamin D deficiency Low Vitamin D level contributes to fatigue and are associated with obesity, breast, and colon cancer. We will refill prescription Vitamin D for 90 days with no refills. Ryan Hancock is to take Vit D every other week. We will recheck labs in 2 months, and he will follow-up for routine testing of Vitamin D, at least 2-3 times per year to avoid over-replacement.  - Vitamin D, Ergocalciferol, (DRISDOL) 1.25 MG (50000 UNIT) CAPS capsule; Take 1 capsule (50,000 Units total) by mouth every 7 (seven) days.  Dispense: 12 capsule; Refill: 0  2. Essential hypertension Ryan Hancock will continue his medications, diet, and weight loss as is, and we will recheck labs in 2 months. He  will watch for signs of hypotension as he continues his lifestyle modifications.  3. At risk for heart disease Ryan Hancock was given approximately 15 minutes of coronary artery disease prevention counseling today. He is 60 y.o. male and has risk factors for heart disease including obesity. We discussed intensive lifestyle modifications today with an emphasis on specific weight loss instructions and strategies.   Repetitive spaced learning was employed today to elicit superior memory formation and behavioral change.  4. Obesity with current BMI of 34.3 Ryan Hancock is currently in the action stage of change. As such, his goal is to continue with weight loss efforts. He has agreed to the Category 4 Plan.   Exercise goals: As is.  Behavioral modification strategies: increasing lean protein intake.  Ryan Hancock has agreed to follow-up with our clinic in 2 to 3 weeks. He was informed of the importance of frequent follow-up visits to maximize his success with intensive lifestyle modifications for his multiple health conditions.   Objective:   Blood pressure 110/71, pulse 69, temperature 98.1 F (36.7 C), height 6' (1.829 m), weight 252 lb (114.3 kg), SpO2 98 %. Body mass index is 34.18 kg/m.  General: Cooperative, alert, well developed, in no acute distress. HEENT: Conjunctivae and lids unremarkable. Cardiovascular: Regular rhythm.  Lungs: Normal work of breathing. Neurologic: No focal deficits.   Lab Results  Component Value Date   CREATININE 0.79 08/30/2020   BUN 21 08/30/2020   NA 140 08/30/2020   K 4.8 08/30/2020   CL 102 08/30/2020  CO2 23 08/30/2020   Lab Results  Component Value Date   ALT 30 08/30/2020   AST 21 08/30/2020   ALKPHOS 79 08/30/2020   BILITOT 0.5 08/30/2020   Lab Results  Component Value Date   HGBA1C 5.9 (H) 08/30/2020   HGBA1C 6.5 (H) 04/22/2020   Lab Results  Component Value Date   INSULIN 12.5 08/30/2020   INSULIN 16.0 04/22/2020   Lab Results  Component Value  Date   TSH 2.300 08/30/2020   Lab Results  Component Value Date   CHOL 169 04/22/2020   HDL 48 04/22/2020   LDLCALC 97 04/22/2020   TRIG 137 04/22/2020   Lab Results  Component Value Date   VD25OH 73.4 08/30/2020   VD25OH 41.9 04/22/2020   Lab Results  Component Value Date   WBC 8.6 04/22/2020   HGB 15.1 04/22/2020   HCT 44.0 04/22/2020   MCV 94 04/22/2020   PLT 309 04/22/2020   No results found for: IRON, TIBC, FERRITIN  Attestation Statements:   Reviewed by clinician on day of visit: allergies, medications, problem list, medical history, surgical history, family history, social history, and previous encounter notes.   I, Burt Knack, am acting as transcriptionist for Quillian Quince, MD.  I have reviewed the above documentation for accuracy and completeness, and I agree with the above. -  Quillian Quince, MD

## 2020-11-22 ENCOUNTER — Ambulatory Visit (INDEPENDENT_AMBULATORY_CARE_PROVIDER_SITE_OTHER): Payer: BC Managed Care – PPO | Admitting: Family Medicine

## 2020-11-22 ENCOUNTER — Encounter (INDEPENDENT_AMBULATORY_CARE_PROVIDER_SITE_OTHER): Payer: Self-pay | Admitting: Family Medicine

## 2020-11-22 ENCOUNTER — Other Ambulatory Visit: Payer: Self-pay

## 2020-11-22 VITALS — BP 124/77 | HR 74 | Temp 98.2°F | Ht <= 58 in | Wt 252.0 lb

## 2020-11-22 DIAGNOSIS — E161 Other hypoglycemia: Secondary | ICD-10-CM | POA: Diagnosis not present

## 2020-11-22 DIAGNOSIS — Z6838 Body mass index (BMI) 38.0-38.9, adult: Secondary | ICD-10-CM

## 2020-11-23 NOTE — Progress Notes (Signed)
Chief Complaint:   OBESITY Ryan Hancock is here to discuss his progress with his obesity treatment plan along with follow-up of his obesity related diagnoses. Ryan Hancock is on the Category 4 Plan and states he is following his eating plan approximately 85% of the time. Ryan Hancock states he is cycling, and doing light stretching for 30 minutes 4 times per week.  Today's visit was #: 10 Starting weight: 284 lbs Starting date: 04/22/2020 Today's weight: 252 lbs Today's date: 11/22/2020 Total lbs lost to date: 32 Total lbs lost since last in-office visit: 0  Interim History: Ryan Hancock has done well with maintaining his weight. He had some extra challenges this weekend, but he is already getting back on track.  Subjective:   1. Other hypoglycemia Ryan Hancock had an episode of hypoglycemia when he went too long without eating.  Assessment/Plan:   1. Other hypoglycemia Ryan Hancock will continue with his diet and weight loss, and will continue to monitor. He has snacks to help prevent future episodes.  2. Obesity with current BMI of 34.3 Ryan Hancock is currently in the action stage of change. As such, his goal is to continue with weight loss efforts. He has agreed to the Category 4 Plan.   Exercise goals: As is.  Behavioral modification strategies: increasing lean protein intake and holiday eating strategies .  Ryan Hancock has agreed to follow-up with our clinic in 3 weeks. He was informed of the importance of frequent follow-up visits to maximize his success with intensive lifestyle modifications for his multiple health conditions.   Objective:   Blood pressure 124/77, pulse 74, temperature 98.2 F (36.8 C), height (!) 6" (0.152 m), weight 252 lb (114.3 kg), SpO2 96 %. Body mass index is 4,921.54 kg/m.  General: Cooperative, alert, well developed, in no acute distress. HEENT: Conjunctivae and lids unremarkable. Cardiovascular: Regular rhythm.  Lungs: Normal work of breathing. Neurologic: No focal deficits.   Lab Results   Component Value Date   CREATININE 0.79 08/30/2020   BUN 21 08/30/2020   NA 140 08/30/2020   K 4.8 08/30/2020   CL 102 08/30/2020   CO2 23 08/30/2020   Lab Results  Component Value Date   ALT 30 08/30/2020   AST 21 08/30/2020   ALKPHOS 79 08/30/2020   BILITOT 0.5 08/30/2020   Lab Results  Component Value Date   HGBA1C 5.9 (H) 08/30/2020   HGBA1C 6.5 (H) 04/22/2020   Lab Results  Component Value Date   INSULIN 12.5 08/30/2020   INSULIN 16.0 04/22/2020   Lab Results  Component Value Date   TSH 2.300 08/30/2020   Lab Results  Component Value Date   CHOL 169 04/22/2020   HDL 48 04/22/2020   LDLCALC 97 04/22/2020   TRIG 137 04/22/2020   Lab Results  Component Value Date   VD25OH 73.4 08/30/2020   VD25OH 41.9 04/22/2020   Lab Results  Component Value Date   WBC 8.6 04/22/2020   HGB 15.1 04/22/2020   HCT 44.0 04/22/2020   MCV 94 04/22/2020   PLT 309 04/22/2020   No results found for: IRON, TIBC, FERRITIN  Attestation Statements:   Reviewed by clinician on day of visit: allergies, medications, problem list, medical history, surgical history, family history, social history, and previous encounter notes.  Time spent on visit including pre-visit chart review and post-visit care and charting was 22 minutes.    I, Ryan Hancock, am acting as transcriptionist for Ryan Quince, MD.  I have reviewed the above documentation for accuracy and  completeness, and I agree with the above. -  Ryan Nip, MD

## 2020-12-16 ENCOUNTER — Encounter (INDEPENDENT_AMBULATORY_CARE_PROVIDER_SITE_OTHER): Payer: Self-pay | Admitting: Family Medicine

## 2020-12-16 ENCOUNTER — Other Ambulatory Visit: Payer: Self-pay

## 2020-12-16 ENCOUNTER — Ambulatory Visit (INDEPENDENT_AMBULATORY_CARE_PROVIDER_SITE_OTHER): Payer: BC Managed Care – PPO | Admitting: Family Medicine

## 2020-12-16 VITALS — BP 114/73 | HR 69 | Temp 97.8°F | Ht 72.0 in | Wt 248.0 lb

## 2020-12-16 DIAGNOSIS — E559 Vitamin D deficiency, unspecified: Secondary | ICD-10-CM

## 2020-12-16 DIAGNOSIS — I10 Essential (primary) hypertension: Secondary | ICD-10-CM | POA: Diagnosis not present

## 2020-12-16 DIAGNOSIS — Z9189 Other specified personal risk factors, not elsewhere classified: Secondary | ICD-10-CM | POA: Diagnosis not present

## 2020-12-16 DIAGNOSIS — Z6838 Body mass index (BMI) 38.0-38.9, adult: Secondary | ICD-10-CM

## 2020-12-16 MED ORDER — VITAMIN D (ERGOCALCIFEROL) 1.25 MG (50000 UNIT) PO CAPS: 50000.0000 [IU] | ORAL_CAPSULE | ORAL | 0 refills | Status: DC

## 2020-12-20 NOTE — Progress Notes (Signed)
Chief Complaint:   OBESITY Ryan Hancock is here to discuss his progress with his obesity treatment plan along with follow-up of his obesity related diagnoses. Ryan Hancock is on the Category 4 Plan and states he is following his eating plan approximately 85% of the time. Ryan Hancock states he is riding the exercise bike and stretching for 15-30 minutes 3-4 times per week.  Today's visit was #: 11 Starting weight: 284 lbs Starting date: 04/22/2020 Today's weight: 248 lbs Today's date: 12/16/2020 Total lbs lost to date: 36 Total lbs lost since last in-office visit: 4  Interim History: Ryan Hancock continues to do well with diet and weight loss. He is exercising, but he is doing a lot more yard work.  Subjective:   1. Vitamin D deficiency Ryan Hancock's Vit D level is at goal. He has no signs of over-replacement.  2. Essential hypertension Ryan Hancock's blood pressure is stable on his medications. He is working on diet, exercise, and weight loss. He denies chest pain or headache.  3. At risk for heart disease Ryan Hancock is at a higher than average risk for cardiovascular disease due to obesity.   Assessment/Plan:   1. Vitamin D deficiency Low Vitamin D level contributes to fatigue and are associated with obesity, breast, and colon cancer. We will refill prescription Vitamin D for 90 days with no refills. Ryan Hancock will follow-up for routine testing of Vitamin D, at least 2-3 times per year to avoid over-replacement.  - Vitamin D, Ergocalciferol, (DRISDOL) 1.25 MG (50000 UNIT) CAPS capsule; Take 1 capsule (50,000 Units total) by mouth every 7 (seven) days.  Dispense: 12 capsule; Refill: 0  2. Essential hypertension Ryan Hancock will continue his diet and exercise, and continue to increase his water intake. He will watch for signs of hypotension as he continues his lifestyle modifications.  3. At risk for heart disease Ryan Hancock was given approximately 15 minutes of coronary artery disease prevention counseling today. He is 60 y.o. male and has  risk factors for heart disease including obesity. We discussed intensive lifestyle modifications today with an emphasis on specific weight loss instructions and strategies.   Repetitive spaced learning was employed today to elicit superior memory formation and behavioral change.  4. Obesity BMI today is 78 Ryan Hancock is currently in the action stage of change. As such, his goal is to continue with weight loss efforts. He has agreed to the Category 4 Plan.   Exercise goals: As is.  Behavioral modification strategies: travel eating strategies and holiday eating strategies .  Ryan Hancock has agreed to follow-up with our clinic in 4 weeks. He was informed of the importance of frequent follow-up visits to maximize his success with intensive lifestyle modifications for his multiple health conditions.   Objective:   Blood pressure 114/73, pulse 69, temperature 97.8 F (36.6 C), height 6' (1.829 m), weight 248 lb (112.5 kg), SpO2 98 %. Body mass index is 33.63 kg/m.  General: Cooperative, alert, well developed, in no acute distress. HEENT: Conjunctivae and lids unremarkable. Cardiovascular: Regular rhythm.  Lungs: Normal work of breathing. Neurologic: No focal deficits.   Lab Results  Component Value Date   CREATININE 0.79 08/30/2020   BUN 21 08/30/2020   NA 140 08/30/2020   K 4.8 08/30/2020   CL 102 08/30/2020   CO2 23 08/30/2020   Lab Results  Component Value Date   ALT 30 08/30/2020   AST 21 08/30/2020   ALKPHOS 79 08/30/2020   BILITOT 0.5 08/30/2020   Lab Results  Component Value Date  HGBA1C 5.9 (H) 08/30/2020   HGBA1C 6.5 (H) 04/22/2020   Lab Results  Component Value Date   INSULIN 12.5 08/30/2020   INSULIN 16.0 04/22/2020   Lab Results  Component Value Date   TSH 2.300 08/30/2020   Lab Results  Component Value Date   CHOL 169 04/22/2020   HDL 48 04/22/2020   LDLCALC 97 04/22/2020   TRIG 137 04/22/2020   Lab Results  Component Value Date   VD25OH 73.4 08/30/2020    VD25OH 41.9 04/22/2020   Lab Results  Component Value Date   WBC 8.6 04/22/2020   HGB 15.1 04/22/2020   HCT 44.0 04/22/2020   MCV 94 04/22/2020   PLT 309 04/22/2020   No results found for: IRON, TIBC, FERRITIN  Attestation Statements:   Reviewed by clinician on day of visit: allergies, medications, problem list, medical history, surgical history, family history, social history, and previous encounter notes.   I, Burt Knack, am acting as transcriptionist for Quillian Quince, MD.  I have reviewed the above documentation for accuracy and completeness, and I agree with the above. -  Quillian Quince, MD

## 2021-01-06 DIAGNOSIS — G4733 Obstructive sleep apnea (adult) (pediatric): Secondary | ICD-10-CM | POA: Diagnosis not present

## 2021-01-06 DIAGNOSIS — E781 Pure hyperglyceridemia: Secondary | ICD-10-CM | POA: Diagnosis not present

## 2021-01-06 DIAGNOSIS — E119 Type 2 diabetes mellitus without complications: Secondary | ICD-10-CM | POA: Diagnosis not present

## 2021-01-06 DIAGNOSIS — I10 Essential (primary) hypertension: Secondary | ICD-10-CM | POA: Diagnosis not present

## 2021-01-06 DIAGNOSIS — E559 Vitamin D deficiency, unspecified: Secondary | ICD-10-CM | POA: Diagnosis not present

## 2021-01-06 DIAGNOSIS — E039 Hypothyroidism, unspecified: Secondary | ICD-10-CM | POA: Diagnosis not present

## 2021-01-10 DIAGNOSIS — Z1211 Encounter for screening for malignant neoplasm of colon: Secondary | ICD-10-CM | POA: Diagnosis not present

## 2021-01-13 ENCOUNTER — Encounter (INDEPENDENT_AMBULATORY_CARE_PROVIDER_SITE_OTHER): Payer: Self-pay

## 2021-01-13 ENCOUNTER — Ambulatory Visit (INDEPENDENT_AMBULATORY_CARE_PROVIDER_SITE_OTHER): Payer: BC Managed Care – PPO | Admitting: Family Medicine

## 2021-01-13 LAB — EXTERNAL GENERIC LAB PROCEDURE: COLOGUARD: NEGATIVE

## 2021-02-15 ENCOUNTER — Ambulatory Visit (INDEPENDENT_AMBULATORY_CARE_PROVIDER_SITE_OTHER): Payer: BC Managed Care – PPO | Admitting: Family Medicine

## 2021-02-28 ENCOUNTER — Ambulatory Visit (INDEPENDENT_AMBULATORY_CARE_PROVIDER_SITE_OTHER): Payer: BC Managed Care – PPO | Admitting: Adult Health

## 2021-03-08 ENCOUNTER — Encounter (INDEPENDENT_AMBULATORY_CARE_PROVIDER_SITE_OTHER): Payer: Self-pay | Admitting: Adult Health

## 2021-03-08 ENCOUNTER — Ambulatory Visit (INDEPENDENT_AMBULATORY_CARE_PROVIDER_SITE_OTHER): Payer: BC Managed Care – PPO | Admitting: Adult Health

## 2021-03-08 ENCOUNTER — Other Ambulatory Visit: Payer: Self-pay

## 2021-03-08 VITALS — BP 125/76 | HR 83 | Temp 97.7°F | Ht 72.0 in | Wt 249.0 lb

## 2021-03-08 DIAGNOSIS — E669 Obesity, unspecified: Secondary | ICD-10-CM

## 2021-03-08 DIAGNOSIS — E559 Vitamin D deficiency, unspecified: Secondary | ICD-10-CM | POA: Diagnosis not present

## 2021-03-08 DIAGNOSIS — Z6838 Body mass index (BMI) 38.0-38.9, adult: Secondary | ICD-10-CM

## 2021-03-08 DIAGNOSIS — Z6833 Body mass index (BMI) 33.0-33.9, adult: Secondary | ICD-10-CM

## 2021-03-09 NOTE — Progress Notes (Signed)
Chief Complaint:   OBESITY Ryan Hancock is here to discuss his progress with his obesity treatment plan along with follow-up of his obesity related diagnoses. Ryan Hancock is on the Category 4 Plan and states he is following his eating plan approximately 80% of the time. Ryan Hancock states he is not exercising regularly due to being sick.  Today's visit was #: 12 Starting weight: 284 lbs Starting date: 04/22/2020 Today's weight: 249 lbs Today's date: 03/08/2021 Total lbs lost to date: 35 lbs Total lbs lost since last in-office visit: 0  Interim History:  02/08/2021 - 02/12/2021,He traveled to [facility] - [employer]. On 02/13/2021, he developed fever/congestion/fatigue. His family tested + COVID-19. When he tested, his result was negative, however he likely tested when his viral load was too low to detect.  Travel to [place] this upcoming Friday - Monday.  Subjective:   1. Vitamin D deficiency 08/30/20 Vit D Level-73.4 He has been off Ergocalciferol since 12/2020 He denies excessive fatigue.  Assessment/Plan:   1. Vitamin D deficiency Check labs at next office visit.  2. Obesity BMI today is 9  Ryan Hancock is currently in the action stage of change. As such, his goal is to continue with weight loss efforts. He has agreed to the Category 4 Plan.   Check fasting labs at next office visit.  Exercise goals:  Increase activity as tolerated.  Behavioral modification strategies: increasing lean protein intake, decreasing simple carbohydrates, meal planning and cooking strategies, keeping healthy foods in the home, and planning for success.  Ryan Hancock has agreed to follow-up with our clinic in 3-4 weeks, fasting. He was informed of the importance of frequent follow-up visits to maximize his success with intensive lifestyle modifications for his multiple health conditions.   Objective:   Blood pressure 125/76, pulse 83, temperature 97.7 F (36.5 C), height 6' (1.829 m), weight 249 lb (112.9 kg), SpO2  99 %. Body mass index is 33.77 kg/m.  General: Cooperative, alert, well developed, in no acute distress. HEENT: Conjunctivae and lids unremarkable. Cardiovascular: Regular rhythm.  Lungs: Normal work of breathing. Neurologic: No focal deficits.   Lab Results  Component Value Date   CREATININE 0.79 08/30/2020   BUN 21 08/30/2020   NA 140 08/30/2020   K 4.8 08/30/2020   CL 102 08/30/2020   CO2 23 08/30/2020   Lab Results  Component Value Date   ALT 30 08/30/2020   AST 21 08/30/2020   ALKPHOS 79 08/30/2020   BILITOT 0.5 08/30/2020   Lab Results  Component Value Date   HGBA1C 5.9 (H) 08/30/2020   HGBA1C 6.5 (H) 04/22/2020   Lab Results  Component Value Date   INSULIN 12.5 08/30/2020   INSULIN 16.0 04/22/2020   Lab Results  Component Value Date   TSH 2.300 08/30/2020   Lab Results  Component Value Date   CHOL 169 04/22/2020   HDL 48 04/22/2020   LDLCALC 97 04/22/2020   TRIG 137 04/22/2020   Lab Results  Component Value Date   VD25OH 73.4 08/30/2020   VD25OH 41.9 04/22/2020   Lab Results  Component Value Date   WBC 8.6 04/22/2020   HGB 15.1 04/22/2020   HCT 44.0 04/22/2020   MCV 94 04/22/2020   PLT 309 04/22/2020   Attestation Statements:   Reviewed by clinician on day of visit: allergies, medications, problem list, medical history, surgical history, family history, social history, and previous encounter notes.  Time spent on visit including pre-visit chart review and post-visit care and  charting was 27 minutes.   I, Insurance claims handler, CMA, am acting as Energy manager for William Hamburger, NP.  I have reviewed the above documentation for accuracy and completeness, and I agree with the above. -  Graciela Plato d. Jarrel Knoke, NP-C

## 2021-03-16 ENCOUNTER — Encounter (INDEPENDENT_AMBULATORY_CARE_PROVIDER_SITE_OTHER): Payer: Self-pay

## 2021-03-24 ENCOUNTER — Encounter (INDEPENDENT_AMBULATORY_CARE_PROVIDER_SITE_OTHER): Payer: Self-pay

## 2021-04-07 ENCOUNTER — Ambulatory Visit (INDEPENDENT_AMBULATORY_CARE_PROVIDER_SITE_OTHER): Payer: BC Managed Care – PPO | Admitting: Adult Health

## 2021-05-16 ENCOUNTER — Ambulatory Visit (INDEPENDENT_AMBULATORY_CARE_PROVIDER_SITE_OTHER): Payer: BC Managed Care – PPO | Admitting: Physician Assistant

## 2021-05-16 ENCOUNTER — Encounter (INDEPENDENT_AMBULATORY_CARE_PROVIDER_SITE_OTHER): Payer: Self-pay | Admitting: Physician Assistant

## 2021-05-16 VITALS — BP 113/69 | HR 76 | Temp 97.9°F | Ht 72.0 in | Wt 256.0 lb

## 2021-05-16 DIAGNOSIS — E7849 Other hyperlipidemia: Secondary | ICD-10-CM

## 2021-05-16 DIAGNOSIS — E559 Vitamin D deficiency, unspecified: Secondary | ICD-10-CM | POA: Diagnosis not present

## 2021-05-16 DIAGNOSIS — E038 Other specified hypothyroidism: Secondary | ICD-10-CM | POA: Diagnosis not present

## 2021-05-16 DIAGNOSIS — E1169 Type 2 diabetes mellitus with other specified complication: Secondary | ICD-10-CM | POA: Diagnosis not present

## 2021-05-16 DIAGNOSIS — E669 Obesity, unspecified: Secondary | ICD-10-CM

## 2021-05-16 DIAGNOSIS — Z6833 Body mass index (BMI) 33.0-33.9, adult: Secondary | ICD-10-CM

## 2021-05-16 DIAGNOSIS — Z9189 Other specified personal risk factors, not elsewhere classified: Secondary | ICD-10-CM

## 2021-05-17 LAB — COMPREHENSIVE METABOLIC PANEL
ALT: 22 IU/L (ref 0–44)
AST: 19 IU/L (ref 0–40)
Albumin/Globulin Ratio: 2.1 (ref 1.2–2.2)
Albumin: 4.6 g/dL (ref 3.8–4.8)
Alkaline Phosphatase: 58 IU/L (ref 44–121)
BUN/Creatinine Ratio: 21 (ref 10–24)
BUN: 19 mg/dL (ref 8–27)
Bilirubin Total: 0.5 mg/dL (ref 0.0–1.2)
CO2: 24 mmol/L (ref 20–29)
Calcium: 9.6 mg/dL (ref 8.6–10.2)
Chloride: 104 mmol/L (ref 96–106)
Creatinine, Ser: 0.91 mg/dL (ref 0.76–1.27)
Globulin, Total: 2.2 g/dL (ref 1.5–4.5)
Glucose: 105 mg/dL — ABNORMAL HIGH (ref 70–99)
Potassium: 4.6 mmol/L (ref 3.5–5.2)
Sodium: 140 mmol/L (ref 134–144)
Total Protein: 6.8 g/dL (ref 6.0–8.5)
eGFR: 96 mL/min/{1.73_m2} (ref >59)

## 2021-05-17 LAB — LIPID PANEL
Chol/HDL Ratio: 3 ratio (ref 0.0–5.0)
Cholesterol, Total: 175 mg/dL (ref 100–199)
HDL: 58 mg/dL (ref >39)
LDL Chol Calc (NIH): 101 mg/dL — ABNORMAL HIGH (ref 0–99)
Triglycerides: 84 mg/dL (ref 0–149)
VLDL Cholesterol Cal: 16 mg/dL (ref 5–40)

## 2021-05-17 LAB — INSULIN, RANDOM: INSULIN: 11.2 u[IU]/mL (ref 2.6–24.9)

## 2021-05-17 LAB — HEMOGLOBIN A1C
Est. average glucose Bld gHb Est-mCnc: 108 mg/dL
Hgb A1c MFr Bld: 5.4 % (ref 4.8–5.6)

## 2021-05-17 LAB — THYROID PANEL WITH TSH
Free Thyroxine Index: 1.8 (ref 1.2–4.9)
T3 Uptake Ratio: 28 % (ref 24–39)
T4, Total: 6.6 ug/dL (ref 4.5–12.0)
TSH: 1.97 u[IU]/mL (ref 0.450–4.500)

## 2021-05-17 LAB — VITAMIN D 25 HYDROXY (VIT D DEFICIENCY, FRACTURES): Vit D, 25-Hydroxy: 38 ng/mL (ref 30.0–100.0)

## 2021-05-17 NOTE — Progress Notes (Signed)
? ? ? ?Chief Complaint:  ? ?OBESITY ?Ryan Hancock is here to discuss his progress with his obesity treatment plan along with follow-up of his obesity related diagnoses. Ryan Hancock is on the Category 4 Plan and states he is following his eating plan approximately 75% of the time. Ryan Hancock states he is doing 0 minutes 0 times per week. ? ?Today's visit was #: 13 ?Starting weight: 284 lbs ?Starting date: 04/22/2020 ?Today's weight: 256 lbs ?Today's date: 05/16/2021 ?Total lbs lost to date: 28 lbs ?Total lbs lost since last in-office visit: 0 ? ?Interim History: Ryan Hancock travels for work 1 week a month. The weeks he is at home, he follows the plan well, except for weekends. Weekends he drinks alcohol, a few drinks Friday and Saturday night, which causes him to snack more.  ? ?Subjective:  ? ?1. Other hyperlipidemia ?Ryan Hancock is currently on pravastatin and he is taking as prescribed.  ? ?2. Type 2 diabetes mellitus with other specified complication, without long-term current use of insulin (HCC) ?Ryan Hancock last A1C was 5.9. He is currently not on medications. He is drinking a lot of alcohol recently.  ? ?3. Vitamin D deficiency ?Ryan Hancock is not on medication currently. His last Vitamin D was 73.4. ? ?4. Other specified hypothyroidism ?Ryan Hancock is currently on levothyroxine. His last TSH was at goal.  ? ?5. At risk for heart disease ?Ryan Hancock is at higher than average risk for cardiovascular disease due to obesity.  ? ?Assessment/Plan:  ? ?1. Other hyperlipidemia ?Cardiovascular risk and specific lipid/LDL goals reviewed.  We will check lipid panel today. He will continue taking pravastatin. We discussed several lifestyle modifications today and Paarth will continue to work on diet, exercise and weight loss efforts. Orders and follow up as documented in patient record.  ? ?Counseling ?Intensive lifestyle modifications are the first line treatment for this issue. ?Dietary changes: Increase soluble fiber. Decrease simple carbohydrates. ?Exercise changes: Moderate to  vigorous-intensity aerobic activity 150 minutes per week if tolerated. ?Lipid-lowering medications: see documented in medical record. ? ?- Lipid panel ? ?2. Type 2 diabetes mellitus with other specified complication, without long-term current use of insulin (HCC) ?Ryan Hancock will continue with plan. He will decrease alcohol. We will check labs today. Good blood sugar control is important to decrease the likelihood of diabetic complications such as nephropathy, neuropathy, limb loss, blindness, coronary artery disease, and death. Intensive lifestyle modification including diet, exercise and weight loss are the first line of treatment for diabetes.  ? ?- Comprehensive metabolic panel ?- Hemoglobin A1c ?- Insulin, random ? ?3. Vitamin D deficiency ?Low Vitamin D level contributes to fatigue and are associated with obesity, breast, and colon cancer. We will check Vitamin D today and he will follow-up for routine testing of Vitamin D, at least 2-3 times per year to avoid over-replacement. ? ?- VITAMIN D 25 Hydroxy (Vit-D Deficiency, Fractures) ? ?4. Other specified hypothyroidism ?We will check Thyroid panel today. Ryan Hancock will continue Levothyroxine. Orders and follow up as documented in patient record. ? ?Counseling ?Good thyroid control is important for overall health. Supratherapeutic thyroid levels are dangerous and will not improve weight loss results. ?Counseling: The correct way to take levothyroxine is fasting, with water, separated by at least 30 minutes from breakfast, and separated by more than 4 hours from calcium, iron, multivitamins, acid reflux medications (PPIs).   ?- Thyroid Panel With TSH ? ?5. At risk for heart disease ?Ryan Hancock was given approximately 15 minutes of coronary artery disease prevention counseling today. He is 61 y.o.  male and has risk factors for heart disease including obesity. We discussed intensive lifestyle modifications today with an emphasis on specific weight loss instructions and  strategies. ? ?Repetitive spaced learning was employed today to elicit superior memory formation and behavioral change.   ? ?6. Obesity BMI today is 33.0 ?Ryan Hancock is currently in the action stage of change. As such, his goal is to continue with weight loss efforts. He has agreed to the Category 4 Plan.  ? ?Exercise goals: No exercise has been prescribed at this time. ? ?Behavioral modification strategies: increasing lean protein intake and decreasing liquid calories. ? ?Ryan Hancock has agreed to follow-up with our clinic in 2-3 weeks. He was informed of the importance of frequent follow-up visits to maximize his success with intensive lifestyle modifications for his multiple health conditions.  ? ?Ryan Hancock was informed we would discuss his lab results at his next visit unless there is a critical issue that needs to be addressed sooner. Ryan Hancock agreed to keep his next visit at the agreed upon time to discuss these results. ? ?Objective:  ? ?Blood pressure 113/69, pulse 76, temperature 97.9 ?F (36.6 ?C), height 6' (1.829 m), weight 256 lb (116.1 kg), SpO2 97 %. ?Body mass index is 34.72 kg/m?. ? ?General: Cooperative, alert, well developed, in no acute distress. ?HEENT: Conjunctivae and lids unremarkable. ?Cardiovascular: Regular rhythm.  ?Lungs: Normal work of breathing. ?Neurologic: No focal deficits.  ? ?Lab Results  ?Component Value Date  ? CREATININE 0.79 08/30/2020  ? BUN 21 08/30/2020  ? NA 140 08/30/2020  ? K 4.8 08/30/2020  ? CL 102 08/30/2020  ? CO2 23 08/30/2020  ? ?Lab Results  ?Component Value Date  ? ALT 30 08/30/2020  ? AST 21 08/30/2020  ? ALKPHOS 79 08/30/2020  ? BILITOT 0.5 08/30/2020  ? ?Lab Results  ?Component Value Date  ? HGBA1C 5.9 (H) 08/30/2020  ? HGBA1C 6.5 (H) 04/22/2020  ? ?Lab Results  ?Component Value Date  ? INSULIN 12.5 08/30/2020  ? INSULIN 16.0 04/22/2020  ? ?Lab Results  ?Component Value Date  ? TSH 2.300 08/30/2020  ? ?Lab Results  ?Component Value Date  ? CHOL 169 04/22/2020  ? HDL 48 04/22/2020  ?  LDLCALC 97 04/22/2020  ? TRIG 137 04/22/2020  ? ?Lab Results  ?Component Value Date  ? VD25OH 73.4 08/30/2020  ? VD25OH 41.9 04/22/2020  ? ?Lab Results  ?Component Value Date  ? WBC 8.6 04/22/2020  ? HGB 15.1 04/22/2020  ? HCT 44.0 04/22/2020  ? MCV 94 04/22/2020  ? PLT 309 04/22/2020  ? ?No results found for: IRON, TIBC, FERRITIN ? ?Attestation Statements:  ? ?Reviewed by clinician on day of visit: allergies, medications, problem list, medical history, surgical history, family history, social history, and previous encounter notes. ? ?I, Sindy Messing, am acting as Energy manager for [employer], PA-C. ? ?I have reviewed the above documentation for accuracy and completeness, and I agree with the above. Alois Cliche, PA-C ? ?

## 2021-05-18 DIAGNOSIS — R42 Dizziness and giddiness: Secondary | ICD-10-CM | POA: Diagnosis not present

## 2021-06-06 ENCOUNTER — Encounter (INDEPENDENT_AMBULATORY_CARE_PROVIDER_SITE_OTHER): Payer: Self-pay | Admitting: Family Medicine

## 2021-06-06 ENCOUNTER — Ambulatory Visit (INDEPENDENT_AMBULATORY_CARE_PROVIDER_SITE_OTHER): Payer: BC Managed Care – PPO | Admitting: Family Medicine

## 2021-06-06 VITALS — BP 119/70 | HR 63 | Temp 97.4°F | Ht 72.0 in | Wt 263.0 lb

## 2021-06-06 DIAGNOSIS — Z6835 Body mass index (BMI) 35.0-35.9, adult: Secondary | ICD-10-CM

## 2021-06-06 DIAGNOSIS — E669 Obesity, unspecified: Secondary | ICD-10-CM

## 2021-06-06 DIAGNOSIS — E559 Vitamin D deficiency, unspecified: Secondary | ICD-10-CM

## 2021-06-06 DIAGNOSIS — E1169 Type 2 diabetes mellitus with other specified complication: Secondary | ICD-10-CM | POA: Diagnosis not present

## 2021-06-06 DIAGNOSIS — Z9189 Other specified personal risk factors, not elsewhere classified: Secondary | ICD-10-CM | POA: Insufficient documentation

## 2021-06-06 MED ORDER — VITAMIN D (ERGOCALCIFEROL) 1.25 MG (50000 UNIT) PO CAPS: 50000.0000 [IU] | ORAL_CAPSULE | ORAL | 0 refills | Status: DC

## 2021-06-21 NOTE — Progress Notes (Signed)
Chief Complaint:   OBESITY Jung is here to discuss his progress with his obesity treatment plan along with follow-up of his obesity related diagnoses. Demitrus is on the Category 4 Plan and states he is following his eating plan approximately 50% of the time. Sulaiman states he is doing 0 minutes 0 times per week.  Today's visit was #: 14 Starting weight: 284 lbs Starting date: 04/22/2020 Today's weight: 263 lbs Today's date: 06/06/2021 Total lbs lost to date: 21 Total lbs lost since last in-office visit: 0  Interim History: Florence did a lot of celebration eating over the last month. Most of his weight gain is water weight, and he has gotten back on track.   Subjective:   1. Vitamin D deficiency Myles' Vitamin D level is worsening, and has dropped below goal while off Vitamin D. I discussed labs with the patient today.   2. Type 2 diabetes mellitus with other specified complication, without long-term current use of insulin (HCC) Winnie' diabetes is better controlled now with his diet and weight loss. He is not on medications. I discussed labs with the patient today.   3. At risk for heart disease Xavior is at higher than average risk for cardiovascular disease due to obesity.  Assessment/Plan:   1. Vitamin D deficiency Low Vitamin D level contributes to fatigue and are associated with obesity, breast, and colon cancer. Kentrail agreed to restart prescription Vitamin D 50,000 IU every week with no refills. He will follow-up for routine testing of Vitamin D, at least 2-3 times per year to avoid over-replacement.  - Vitamin D, Ergocalciferol, (DRISDOL) 1.25 MG (50000 UNIT) CAPS capsule; Take 1 capsule (50,000 Units total) by mouth every 7 (seven) days.  Dispense: 4 capsule; Refill: 0  2. Type 2 diabetes mellitus with other specified complication, without long-term current use of insulin (HCC) Parish will continue his diet and exercise, and we will recheck labs in 3 months. Good blood sugar control is  important to decrease the likelihood of diabetic complications such as nephropathy, neuropathy, limb loss, blindness, coronary artery disease, and death. Intensive lifestyle modification including diet, exercise and weight loss are the first line of treatment for diabetes.   3. At risk for heart disease Brixton was given approximately 15 minutes of coronary artery disease prevention counseling today. He is 61 y.o. male and has risk factors for heart disease including obesity. We discussed intensive lifestyle modifications today with an emphasis on specific weight loss instructions and strategies.  Repetitive spaced learning was employed today to elicit superior memory formation and behavioral change.   4. Obesity, Current BMI 35.8 Osten is currently in the action stage of change. As such, his goal is to continue with weight loss efforts. He has agreed to the Category 4 Plan.   Behavioral modification strategies: increasing lean protein intake and meal planning and cooking strategies.  Leocadio has agreed to follow-up with our clinic in 4 to 5 weeks. He was informed of the importance of frequent follow-up visits to maximize his success with intensive lifestyle modifications for his multiple health conditions.   Objective:   Blood pressure 119/70, pulse 63, temperature (!) 97.4 F (36.3 C), height 6' (1.829 m), weight 263 lb (119.3 kg), SpO2 97 %. Body mass index is 35.67 kg/m.  General: Cooperative, alert, well developed, in no acute distress. HEENT: Conjunctivae and lids unremarkable. Cardiovascular: Regular rhythm.  Lungs: Normal work of breathing. Neurologic: No focal deficits.   Lab Results  Component Value Date  CREATININE 0.91 05/16/2021   BUN 19 05/16/2021   NA 140 05/16/2021   K 4.6 05/16/2021   CL 104 05/16/2021   CO2 24 05/16/2021   Lab Results  Component Value Date   ALT 22 05/16/2021   AST 19 05/16/2021   ALKPHOS 58 05/16/2021   BILITOT 0.5 05/16/2021   Lab Results   Component Value Date   HGBA1C 5.4 05/16/2021   HGBA1C 5.9 (H) 08/30/2020   HGBA1C 6.5 (H) 04/22/2020   Lab Results  Component Value Date   INSULIN 11.2 05/16/2021   INSULIN 12.5 08/30/2020   INSULIN 16.0 04/22/2020   Lab Results  Component Value Date   TSH 1.970 05/16/2021   Lab Results  Component Value Date   CHOL 175 05/16/2021   HDL 58 05/16/2021   LDLCALC 101 (H) 05/16/2021   TRIG 84 05/16/2021   CHOLHDL 3.0 05/16/2021   Lab Results  Component Value Date   VD25OH 38.0 05/16/2021   VD25OH 73.4 08/30/2020   VD25OH 41.9 04/22/2020   Lab Results  Component Value Date   WBC 8.6 04/22/2020   HGB 15.1 04/22/2020   HCT 44.0 04/22/2020   MCV 94 04/22/2020   PLT 309 04/22/2020   No results found for: IRON, TIBC, FERRITIN  Attestation Statements:   Reviewed by clinician on day of visit: allergies, medications, problem list, medical history, surgical history, family history, social history, and previous encounter notes.   I, Burt Knack, am acting as transcriptionist for Quillian Quince, MD.  I have reviewed the above documentation for accuracy and completeness, and I agree with the above. -  Quillian Quince, MD

## 2021-07-11 ENCOUNTER — Encounter (INDEPENDENT_AMBULATORY_CARE_PROVIDER_SITE_OTHER): Payer: Self-pay | Admitting: Family Medicine

## 2021-07-11 ENCOUNTER — Ambulatory Visit (INDEPENDENT_AMBULATORY_CARE_PROVIDER_SITE_OTHER): Payer: BC Managed Care – PPO | Admitting: Family Medicine

## 2021-07-11 VITALS — BP 108/68 | HR 68 | Ht 72.0 in | Wt 255.0 lb

## 2021-07-11 DIAGNOSIS — E559 Vitamin D deficiency, unspecified: Secondary | ICD-10-CM

## 2021-07-11 DIAGNOSIS — E66812 Obesity, class 2: Secondary | ICD-10-CM

## 2021-07-11 DIAGNOSIS — E669 Obesity, unspecified: Secondary | ICD-10-CM

## 2021-07-11 DIAGNOSIS — Z6834 Body mass index (BMI) 34.0-34.9, adult: Secondary | ICD-10-CM | POA: Diagnosis not present

## 2021-07-11 DIAGNOSIS — Z9189 Other specified personal risk factors, not elsewhere classified: Secondary | ICD-10-CM

## 2021-07-11 MED ORDER — VITAMIN D (ERGOCALCIFEROL) 1.25 MG (50000 UNIT) PO CAPS: 50000.0000 [IU] | ORAL_CAPSULE | ORAL | 0 refills | Status: DC

## 2021-07-13 NOTE — Progress Notes (Signed)
Chief Complaint:   OBESITY Ryan Hancock is here to discuss his progress with his obesity treatment plan along with follow-up of his obesity related diagnoses. Ryan Hancock is on the Category 4 Plan and states he is following his eating plan approximately 75% of the time. Ryan Hancock states he is walking for 30 minutes 3-4 times per week.  Today's visit was #: 15 Starting weight: 284 lbs Starting date: 04/22/2020 Today's weight: 255 lbs Today's date: 07/11/2021 Total lbs lost to date: 29 Total lbs lost since last in-office visit: 8  Interim History: Ryan Hancock has done well with weight loss after gaining some weight last visit. He notes increased snacking especially with increased stress, but often snacks on high protein food.   Subjective:   1. Vitamin D deficiency Ryan Hancock is on Vitamin D, with no side effects noted. He requests a refill today.   2. At risk for impaired metabolic function Ryan Hancock is at increased risk for impaired metabolic function if protein or calories decreases.  Assessment/Plan:   1. Vitamin D deficiency We will refill prescription Vitamin D for 1 month. Ryan Hancock will follow-up for routine testing of Vitamin D, at least 2-3 times per year to avoid over-replacement.  - Vitamin D, Ergocalciferol, (DRISDOL) 1.25 MG (50000 UNIT) CAPS capsule; Take 1 capsule (50,000 Units total) by mouth every 7 (seven) days.  Dispense: 4 capsule; Refill: 0  2. At risk for impaired metabolic function Ryan Hancock was given approximately 15 minutes of impaired  metabolic function prevention counseling today. We discussed intensive lifestyle modifications today with an emphasis on specific nutrition and exercise instructions and strategies.   Repetitive spaced learning was employed today to elicit superior memory formation and behavioral change.  3. Obesity, Current BMI 34.6 Ryan Hancock is currently in the action stage of change. As such, his goal is to continue with weight loss efforts. He has agreed to the Category 4 Plan.    Exercise goals: As is.  Behavioral modification strategies: increasing lean protein intake and emotional eating strategies.  Ryan Hancock has agreed to follow-up with our clinic in 4 weeks. He was informed of the importance of frequent follow-up visits to maximize his success with intensive lifestyle modifications for his multiple health conditions.   Objective:   Blood pressure 108/68, pulse 68, height 6' (1.829 m), weight 255 lb (115.7 kg). Body mass index is 34.58 kg/m.  General: Cooperative, alert, well developed, in no acute distress. HEENT: Conjunctivae and lids unremarkable. Cardiovascular: Regular rhythm.  Lungs: Normal work of breathing. Neurologic: No focal deficits.   Lab Results  Component Value Date   CREATININE 0.91 05/16/2021   BUN 19 05/16/2021   NA 140 05/16/2021   K 4.6 05/16/2021   CL 104 05/16/2021   CO2 24 05/16/2021   Lab Results  Component Value Date   ALT 22 05/16/2021   AST 19 05/16/2021   ALKPHOS 58 05/16/2021   BILITOT 0.5 05/16/2021   Lab Results  Component Value Date   HGBA1C 5.4 05/16/2021   HGBA1C 5.9 (H) 08/30/2020   HGBA1C 6.5 (H) 04/22/2020   Lab Results  Component Value Date   INSULIN 11.2 05/16/2021   INSULIN 12.5 08/30/2020   INSULIN 16.0 04/22/2020   Lab Results  Component Value Date   TSH 1.970 05/16/2021   Lab Results  Component Value Date   CHOL 175 05/16/2021   HDL 58 05/16/2021   LDLCALC 101 (H) 05/16/2021   TRIG 84 05/16/2021   CHOLHDL 3.0 05/16/2021   Lab Results  Component  Value Date   VD25OH 38.0 05/16/2021   VD25OH 73.4 08/30/2020   VD25OH 41.9 04/22/2020   Lab Results  Component Value Date   WBC 8.6 04/22/2020   HGB 15.1 04/22/2020   HCT 44.0 04/22/2020   MCV 94 04/22/2020   PLT 309 04/22/2020   No results found for: IRON, TIBC, FERRITIN  Attestation Statements:   Reviewed by clinician on day of visit: allergies, medications, problem list, medical history, surgical history, family history, social  history, and previous encounter notes.   I, Burt Knack, am acting as transcriptionist for Quillian Quince, MD.  I have reviewed the above documentation for accuracy and completeness, and I agree with the above. -  Quillian Quince, MD

## 2021-07-28 DIAGNOSIS — G4733 Obstructive sleep apnea (adult) (pediatric): Secondary | ICD-10-CM | POA: Diagnosis not present

## 2021-07-28 DIAGNOSIS — I10 Essential (primary) hypertension: Secondary | ICD-10-CM | POA: Diagnosis not present

## 2021-07-28 DIAGNOSIS — E119 Type 2 diabetes mellitus without complications: Secondary | ICD-10-CM | POA: Diagnosis not present

## 2021-07-28 DIAGNOSIS — Z0001 Encounter for general adult medical examination with abnormal findings: Secondary | ICD-10-CM | POA: Diagnosis not present

## 2021-08-10 ENCOUNTER — Encounter (INDEPENDENT_AMBULATORY_CARE_PROVIDER_SITE_OTHER): Payer: Self-pay | Admitting: Family Medicine

## 2021-08-10 ENCOUNTER — Ambulatory Visit (INDEPENDENT_AMBULATORY_CARE_PROVIDER_SITE_OTHER): Payer: BC Managed Care – PPO | Admitting: Family Medicine

## 2021-08-10 VITALS — BP 128/69 | HR 66 | Temp 97.8°F | Ht 72.0 in | Wt 262.0 lb

## 2021-08-10 DIAGNOSIS — E669 Obesity, unspecified: Secondary | ICD-10-CM

## 2021-08-10 DIAGNOSIS — R7303 Prediabetes: Secondary | ICD-10-CM

## 2021-08-10 DIAGNOSIS — F3289 Other specified depressive episodes: Secondary | ICD-10-CM

## 2021-08-10 DIAGNOSIS — Z6835 Body mass index (BMI) 35.0-35.9, adult: Secondary | ICD-10-CM

## 2021-08-10 DIAGNOSIS — E559 Vitamin D deficiency, unspecified: Secondary | ICD-10-CM

## 2021-08-10 MED ORDER — VITAMIN D (ERGOCALCIFEROL) 1.25 MG (50000 UNIT) PO CAPS: 50000.0000 [IU] | ORAL_CAPSULE | ORAL | 0 refills | Status: DC

## 2021-08-11 MED ORDER — VITAMIN D (ERGOCALCIFEROL) 1.25 MG (50000 UNIT) PO CAPS: 50000.0000 [IU] | ORAL_CAPSULE | ORAL | 0 refills | Status: DC

## 2021-08-11 NOTE — Progress Notes (Signed)
Chief Complaint:   OBESITY Ryan Hancock is here to discuss his progress with his obesity treatment plan along with follow-up of his obesity related diagnoses. Ryan Hancock is on the Category 4 Plan and states he is following his eating plan approximately 75% of the time. Ryan Hancock states he is walking for 30 minutes 3 times per week.  Today's visit was #: 16 Starting weight: 284 lbs Starting date: 04/22/2020 Today's weight: 262 lbs Today's date: 08/10/2021 Total lbs lost to date: 22 Total lbs lost since last in-office visit: 0  Interim History: Ryan Hancock struggled the past few weeks with the news of his son's divorce.  He has been stress eating and snacking, and has increased his ETOH intake.  He is getting meals and on category 4 meal plan.  He has had increased cravings.  Subjective:   1. Vitamin D deficiency [name] last Vitamin D level was 38 on 05/16/2021.  2. Pre-diabetes [name] A1c is improving, and has decreased from 6.5 down to 5.9, and is now 5.4 on his last lab check on 05/16/2021.  3. Other depression with emotional eating Ryan Hancock notes worsening symptoms.  He has good good support system.  He continues to keep junk food out of the house.  He is being mindful of snacking while drinking ETOH.  Assessment/Plan:   1. Vitamin D deficiency Ryan Hancock will continue prescription vitamin D 50,000 units once weekly, and we will refill for 1 month.  - Vitamin D, Ergocalciferol, (DRISDOL) 1.25 MG (50000 UNIT) CAPS capsule; Take 1 capsule (50,000 Units total) by mouth every 7 (seven) days.  Dispense: 4 capsule; Refill: 0  2. Pre-diabetes Ryan Hancock will continue to work on decreasing his intake of sugar and starchy foods, regular exercise, and weight loss.  3. Other depression with emotional eating Ryan Hancock will continue to work on self-care, sleep at night, and regular exercise.  4. Obesity, Current BMI 35.6 Ryan Hancock is currently in the action stage of change. As such, his goal is to continue with weight loss efforts. He  has agreed to the Category 4 Plan.   Continue category 4 meal plan.  Strategies for stress reduction were discussed.  Keeping junk food out of the house.  Exercise goals: As is.   Behavioral modification strategies: increasing lean protein intake, decreasing alcohol intake, emotional eating strategies, and decreasing junk food.  Ryan Hancock has agreed to follow-up with our clinic in 3 weeks. He was informed of the importance of frequent follow-up visits to maximize his success with intensive lifestyle modifications for his multiple health conditions.   Objective:   Blood pressure 128/69, pulse 66, temperature 97.8 F (36.6 C), height 6' (1.829 m), weight 262 lb (118.8 kg), SpO2 99 %. Body mass index is 35.53 kg/m.  General: Cooperative, alert, well developed, in no acute distress. HEENT: Conjunctivae and lids unremarkable. Cardiovascular: Regular rhythm.  Lungs: Normal work of breathing. Neurologic: No focal deficits.   Lab Results  Component Value Date   CREATININE 0.91 05/16/2021   BUN 19 05/16/2021   NA 140 05/16/2021   K 4.6 05/16/2021   CL 104 05/16/2021   CO2 24 05/16/2021   Lab Results  Component Value Date   ALT 22 05/16/2021   AST 19 05/16/2021   ALKPHOS 58 05/16/2021   BILITOT 0.5 05/16/2021   Lab Results  Component Value Date   HGBA1C 5.4 05/16/2021   HGBA1C 5.9 (H) 08/30/2020   HGBA1C 6.5 (H) 04/22/2020   Lab Results  Component Value Date   INSULIN 11.2  05/16/2021   INSULIN 12.5 08/30/2020   INSULIN 16.0 04/22/2020   Lab Results  Component Value Date   TSH 1.970 05/16/2021   Lab Results  Component Value Date   CHOL 175 05/16/2021   HDL 58 05/16/2021   LDLCALC 101 (H) 05/16/2021   TRIG 84 05/16/2021   CHOLHDL 3.0 05/16/2021   Lab Results  Component Value Date   VD25OH 38.0 05/16/2021   VD25OH 73.4 08/30/2020   VD25OH 41.9 04/22/2020   Lab Results  Component Value Date   WBC 8.6 04/22/2020   HGB 15.1 04/22/2020   HCT 44.0 04/22/2020   MCV  94 04/22/2020   PLT 309 04/22/2020   No results found for: "IRON", "TIBC", "FERRITIN"  Attestation Statements:   Reviewed by clinician on day of visit: allergies, medications, problem list, medical history, surgical history, family history, social history, and previous encounter notes.   I, Burt Knack, am acting as transcriptionist for Quillian Quince, MD.  I have reviewed the above documentation for accuracy and completeness, and I agree with the above. -  Quillian Quince, MD

## 2021-09-01 ENCOUNTER — Encounter (INDEPENDENT_AMBULATORY_CARE_PROVIDER_SITE_OTHER): Payer: Self-pay | Admitting: Family Medicine

## 2021-09-01 ENCOUNTER — Ambulatory Visit (INDEPENDENT_AMBULATORY_CARE_PROVIDER_SITE_OTHER): Payer: BC Managed Care – PPO | Admitting: Family Medicine

## 2021-09-01 VITALS — BP 120/76 | HR 75 | Temp 97.8°F | Ht 72.0 in | Wt 257.0 lb

## 2021-09-01 DIAGNOSIS — E559 Vitamin D deficiency, unspecified: Secondary | ICD-10-CM

## 2021-09-01 DIAGNOSIS — F439 Reaction to severe stress, unspecified: Secondary | ICD-10-CM | POA: Diagnosis not present

## 2021-09-01 DIAGNOSIS — Z6834 Body mass index (BMI) 34.0-34.9, adult: Secondary | ICD-10-CM | POA: Diagnosis not present

## 2021-09-01 DIAGNOSIS — E669 Obesity, unspecified: Secondary | ICD-10-CM | POA: Diagnosis not present

## 2021-09-05 MED ORDER — VITAMIN D (ERGOCALCIFEROL) 1.25 MG (50000 UNIT) PO CAPS: 50000.0000 [IU] | ORAL_CAPSULE | ORAL | 0 refills | Status: DC

## 2021-09-06 DIAGNOSIS — E039 Hypothyroidism, unspecified: Secondary | ICD-10-CM | POA: Diagnosis not present

## 2021-09-06 DIAGNOSIS — I10 Essential (primary) hypertension: Secondary | ICD-10-CM | POA: Diagnosis not present

## 2021-09-06 DIAGNOSIS — L0231 Cutaneous abscess of buttock: Secondary | ICD-10-CM | POA: Diagnosis not present

## 2021-09-06 DIAGNOSIS — K612 Anorectal abscess: Secondary | ICD-10-CM | POA: Diagnosis not present

## 2021-09-06 DIAGNOSIS — E559 Vitamin D deficiency, unspecified: Secondary | ICD-10-CM | POA: Diagnosis not present

## 2021-09-06 DIAGNOSIS — E119 Type 2 diabetes mellitus without complications: Secondary | ICD-10-CM | POA: Diagnosis not present

## 2021-09-06 DIAGNOSIS — F40243 Fear of flying: Secondary | ICD-10-CM | POA: Diagnosis not present

## 2021-09-07 DIAGNOSIS — K612 Anorectal abscess: Secondary | ICD-10-CM | POA: Diagnosis not present

## 2021-09-07 DIAGNOSIS — L0231 Cutaneous abscess of buttock: Secondary | ICD-10-CM | POA: Diagnosis not present

## 2021-09-07 DIAGNOSIS — E119 Type 2 diabetes mellitus without complications: Secondary | ICD-10-CM | POA: Diagnosis not present

## 2021-09-07 DIAGNOSIS — E039 Hypothyroidism, unspecified: Secondary | ICD-10-CM | POA: Diagnosis not present

## 2021-09-07 DIAGNOSIS — K611 Rectal abscess: Secondary | ICD-10-CM | POA: Diagnosis not present

## 2021-09-07 DIAGNOSIS — I10 Essential (primary) hypertension: Secondary | ICD-10-CM | POA: Diagnosis not present

## 2021-09-07 NOTE — Progress Notes (Unsigned)
Chief Complaint:   OBESITY Ryan Hancock is here to discuss his progress with his obesity treatment plan along with follow-up of his obesity related diagnoses. [name] is on the Category 4 Plan and states he is following his eating plan approximately 80% of the time. Ryan Hancock states he is walking for 30 minutes 4 times per week.  Today's visit was #: 17 Starting weight: 284 lbs Starting date: 04/22/2020 Today's weight: 257 lbs Today's date: 09/01/2021 Total lbs lost to date: 27 Total lbs lost since last in-office visit: 5  Interim History: Ryan Hancock has done better with getting back on track with his eating plan. His hunger is mostly controlled and he does especially well during the week, but he struggles more on the weekend.   Subjective:   1. Stress [name] is dealing with work stress and some family stress. He is doing well with minimizing emotional eating behaviors, and he is considering counseling to help deal with some irritability. He is currently stable on his medications.   2. Vitamin D deficiency [name] is stable on Vitamin D, with no side effects noted.   Assessment/Plan:   1. Stress Ryan Hancock was offered support and encouragement, and will continue to monitor.   2. Vitamin D deficiency Ryan Hancock will continue prescription Vitamin D 50,000 IU once weekly, and we will refill for 1 month.  - Vitamin D, Ergocalciferol, (DRISDOL) 1.25 MG (50000 UNIT) CAPS capsule; Take 1 capsule (50,000 Units total) by mouth every 7 (seven) days.  Dispense: 4 capsule; Refill: 0  3. Obesity, Current BMI 34.9 [name] is currently in the action stage of change. As such, his goal is to continue with weight loss efforts. He has agreed to the Category 4 Plan.   Exercise goals: As is.   Behavioral modification strategies: increasing water intake and meal planning and cooking strategies.  Ryan Hancock has agreed to follow-up with our clinic in 3 to 4 weeks. He was informed of the importance of frequent follow-up visits to maximize his  success with intensive lifestyle modifications for his multiple health conditions.   Objective:   Blood pressure 120/76, pulse 75, temperature 97.8 F (36.6 C), height 6' (1.829 m), weight 257 lb (116.6 kg), SpO2 97 %. Body mass index is 34.86 kg/m.  General: Cooperative, alert, well developed, in no acute distress. HEENT: Conjunctivae and lids unremarkable. Cardiovascular: Regular rhythm.  Lungs: Normal work of breathing. Neurologic: No focal deficits.   Lab Results  Component Value Date   CREATININE 0.91 05/16/2021   BUN 19 05/16/2021   NA 140 05/16/2021   K 4.6 05/16/2021   CL 104 05/16/2021   CO2 24 05/16/2021   Lab Results  Component Value Date   ALT 22 05/16/2021   AST 19 05/16/2021   ALKPHOS 58 05/16/2021   BILITOT 0.5 05/16/2021   Lab Results  Component Value Date   HGBA1C 5.4 05/16/2021   HGBA1C 5.9 (H) 08/30/2020   HGBA1C 6.5 (H) 04/22/2020   Lab Results  Component Value Date   INSULIN 11.2 05/16/2021   INSULIN 12.5 08/30/2020   INSULIN 16.0 04/22/2020   Lab Results  Component Value Date   TSH 1.970 05/16/2021   Lab Results  Component Value Date   CHOL 175 05/16/2021   HDL 58 05/16/2021   LDLCALC 101 (H) 05/16/2021   TRIG 84 05/16/2021   CHOLHDL 3.0 05/16/2021   Lab Results  Component Value Date   VD25OH 38.0 05/16/2021   VD25OH 73.4 08/30/2020   VD25OH 41.9 04/22/2020  Lab Results  Component Value Date   WBC 8.6 04/22/2020   HGB 15.1 04/22/2020   HCT 44.0 04/22/2020   MCV 94 04/22/2020   PLT 309 04/22/2020   No results found for: "IRON", "TIBC", "FERRITIN"  Attestation Statements:   Reviewed by clinician on day of visit: allergies, medications, problem list, medical history, surgical history, family history, social history, and previous encounter notes.  Time spent on visit including pre-visit chart review and post-visit care and charting was 47 minutes.   I, Burt Knack, am acting as transcriptionist for Quillian Quince,  MD.  I have reviewed the above documentation for accuracy and completeness, and I agree with the above. -  Quillian Quince, MD

## 2021-09-08 DIAGNOSIS — E1169 Type 2 diabetes mellitus with other specified complication: Secondary | ICD-10-CM | POA: Diagnosis not present

## 2021-09-08 DIAGNOSIS — R61 Generalized hyperhidrosis: Secondary | ICD-10-CM | POA: Diagnosis not present

## 2021-09-08 DIAGNOSIS — K612 Anorectal abscess: Secondary | ICD-10-CM | POA: Diagnosis not present

## 2021-09-08 DIAGNOSIS — I10 Essential (primary) hypertension: Secondary | ICD-10-CM | POA: Diagnosis not present

## 2021-09-14 ENCOUNTER — Encounter (INDEPENDENT_AMBULATORY_CARE_PROVIDER_SITE_OTHER): Payer: Self-pay

## 2021-09-26 ENCOUNTER — Ambulatory Visit (INDEPENDENT_AMBULATORY_CARE_PROVIDER_SITE_OTHER): Payer: BC Managed Care – PPO | Admitting: Family Medicine

## 2021-10-25 ENCOUNTER — Ambulatory Visit (INDEPENDENT_AMBULATORY_CARE_PROVIDER_SITE_OTHER): Payer: BC Managed Care – PPO | Admitting: Family Medicine

## 2021-11-15 ENCOUNTER — Ambulatory Visit (INDEPENDENT_AMBULATORY_CARE_PROVIDER_SITE_OTHER): Payer: BC Managed Care – PPO | Admitting: Family Medicine

## 2021-12-12 ENCOUNTER — Ambulatory Visit (INDEPENDENT_AMBULATORY_CARE_PROVIDER_SITE_OTHER): Payer: BC Managed Care – PPO | Admitting: Family Medicine

## 2021-12-12 ENCOUNTER — Other Ambulatory Visit (INDEPENDENT_AMBULATORY_CARE_PROVIDER_SITE_OTHER): Payer: Self-pay | Admitting: Family Medicine

## 2021-12-12 ENCOUNTER — Encounter (INDEPENDENT_AMBULATORY_CARE_PROVIDER_SITE_OTHER): Payer: Self-pay | Admitting: Family Medicine

## 2021-12-12 VITALS — BP 140/86 | HR 79 | Temp 98.0°F | Ht 72.0 in | Wt 271.0 lb

## 2021-12-12 DIAGNOSIS — F3289 Other specified depressive episodes: Secondary | ICD-10-CM

## 2021-12-12 DIAGNOSIS — E669 Obesity, unspecified: Secondary | ICD-10-CM | POA: Diagnosis not present

## 2021-12-12 DIAGNOSIS — I10 Essential (primary) hypertension: Secondary | ICD-10-CM

## 2021-12-12 DIAGNOSIS — E785 Hyperlipidemia, unspecified: Secondary | ICD-10-CM | POA: Diagnosis not present

## 2021-12-12 DIAGNOSIS — Z6836 Body mass index (BMI) 36.0-36.9, adult: Secondary | ICD-10-CM

## 2021-12-12 MED ORDER — TOPIRAMATE 50 MG PO TABS: 50.0000 mg | ORAL_TABLET | Freq: Every day | ORAL | 0 refills | Status: DC

## 2021-12-20 NOTE — Progress Notes (Signed)
Chief Complaint:   OBESITY Ryan Hancock is here to discuss his progress with his obesity treatment plan along with follow-up of his obesity related diagnoses. Arless is on the Category 4 Plan and states he is following his eating plan approximately 50% of the time. Sahith states he is walking and biking for 30 minutes 3 times per week.  Today's visit was #: 18 Starting weight: 284 lbs Starting date: 04/22/2020 Today's weight: 271 lbs Today's date: 12/12/2021 Total lbs lost to date: 13 Total lbs lost since last in-office visit: 0  Interim History: Sebastion has had tremendous work stress and he reports "lost focus on healthy eating". He reports rewarding himself with some weekend drinking and he feels as if he could get this under control. He is eating as a reward even when he is not hungry.   Subjective:   1. Hyperlipidemia, unspecified hyperlipidemia type Broly is not currently taking statin, and he is concerned about side effects. We discussed risks and benefits with the patient. He is thinking about medications for now.   2. Essential hypertension Marin's blood pressure is elevated today. He is not currently taking Zestril.   3. Emotional Eating Behaviors Harden is taking Wellbutrin and Zoloft. He reports eating when he is not hungry and drinking on the weekends.   Assessment/Plan:   1. Hyperlipidemia, unspecified hyperlipidemia type Davontae will continue his healthy eating plan and exercise.   2. Essential hypertension Danta will continue to monitor and we discussed that he may need to resume his medication. He will continue his healthy eating plan.  3. Emotional Eating Behaviors Finnis will continue Wellbutrin and Zoloft. Jaxzen agreed to start Topamax 50 mg once daily with no refills.   - topiramate (TOPAMAX) 50 MG tablet; Take 1 tablet (50 mg total) by mouth daily.  Dispense: 30 tablet; Refill: 0  4. Obesity with current BMI of 36.8 Curley is currently in the action stage of change. As such,  his goal is to continue with weight loss efforts. He has agreed to the Category 4 Plan.   Exercise goals: All adults should avoid inactivity. Some physical activity is better than none, and adults who participate in any amount of physical activity gain some health benefits.  Behavioral modification strategies: increasing lean protein intake, decreasing simple carbohydrates, and meal planning and cooking strategies.  Stepfon has agreed to follow-up with our clinic in 2 weeks. He was informed of the importance of frequent follow-up visits to maximize his success with intensive lifestyle modifications for his multiple health conditions.   Objective:   Blood pressure (!) 140/86, pulse 79, temperature 98 F (36.7 C), height 6' (1.829 m), weight 271 lb (122.9 kg), SpO2 95 %. Body mass index is 36.75 kg/m.  General: Cooperative, alert, well developed, in no acute distress. HEENT: Conjunctivae and lids unremarkable. Cardiovascular: Regular rhythm.  Lungs: Normal work of breathing. Neurologic: No focal deficits.   Lab Results  Component Value Date   CREATININE 0.91 05/16/2021   BUN 19 05/16/2021   NA 140 05/16/2021   K 4.6 05/16/2021   CL 104 05/16/2021   CO2 24 05/16/2021   Lab Results  Component Value Date   ALT 22 05/16/2021   AST 19 05/16/2021   ALKPHOS 58 05/16/2021   BILITOT 0.5 05/16/2021   Lab Results  Component Value Date   HGBA1C 5.4 05/16/2021   HGBA1C 5.9 (H) 08/30/2020   HGBA1C 6.5 (H) 04/22/2020   Lab Results  Component Value Date   INSULIN  11.2 05/16/2021   INSULIN 12.5 08/30/2020   INSULIN 16.0 04/22/2020   Lab Results  Component Value Date   TSH 1.970 05/16/2021   Lab Results  Component Value Date   CHOL 175 05/16/2021   HDL 58 05/16/2021   LDLCALC 101 (H) 05/16/2021   TRIG 84 05/16/2021   CHOLHDL 3.0 05/16/2021   Lab Results  Component Value Date   VD25OH 38.0 05/16/2021   VD25OH 73.4 08/30/2020   VD25OH 41.9 04/22/2020   Lab Results   Component Value Date   WBC 8.6 04/22/2020   HGB 15.1 04/22/2020   HCT 44.0 04/22/2020   MCV 94 04/22/2020   PLT 309 04/22/2020   No results found for: "IRON", "TIBC", "FERRITIN"  Attestation Statements:   Reviewed by clinician on day of visit: allergies, medications, problem list, medical history, surgical history, family history, social history, and previous encounter notes.   I, Burt Knack, am acting as transcriptionist for Quillian Quince, MD.  I have reviewed the above documentation for accuracy and completeness, and I agree with the above. -  Quillian Quince, MD

## 2021-12-26 ENCOUNTER — Ambulatory Visit (INDEPENDENT_AMBULATORY_CARE_PROVIDER_SITE_OTHER): Payer: BC Managed Care – PPO | Admitting: Physician Assistant

## 2022-01-03 ENCOUNTER — Encounter (INDEPENDENT_AMBULATORY_CARE_PROVIDER_SITE_OTHER): Payer: Self-pay | Admitting: Physician Assistant

## 2022-01-03 ENCOUNTER — Ambulatory Visit (INDEPENDENT_AMBULATORY_CARE_PROVIDER_SITE_OTHER): Payer: BC Managed Care – PPO | Admitting: Physician Assistant

## 2022-01-03 VITALS — BP 153/89 | HR 64 | Temp 97.7°F | Ht 72.0 in | Wt 265.0 lb

## 2022-01-03 DIAGNOSIS — E669 Obesity, unspecified: Secondary | ICD-10-CM

## 2022-01-03 DIAGNOSIS — E66812 Obesity, class 2: Secondary | ICD-10-CM

## 2022-01-03 DIAGNOSIS — E785 Hyperlipidemia, unspecified: Secondary | ICD-10-CM | POA: Diagnosis not present

## 2022-01-03 DIAGNOSIS — I10 Essential (primary) hypertension: Secondary | ICD-10-CM | POA: Diagnosis not present

## 2022-01-03 DIAGNOSIS — Z6836 Body mass index (BMI) 36.0-36.9, adult: Secondary | ICD-10-CM

## 2022-01-03 DIAGNOSIS — F3289 Other specified depressive episodes: Secondary | ICD-10-CM | POA: Diagnosis not present

## 2022-01-10 ENCOUNTER — Other Ambulatory Visit (INDEPENDENT_AMBULATORY_CARE_PROVIDER_SITE_OTHER): Payer: Self-pay | Admitting: Family Medicine

## 2022-01-10 DIAGNOSIS — F3289 Other specified depressive episodes: Secondary | ICD-10-CM

## 2022-01-11 ENCOUNTER — Telehealth (INDEPENDENT_AMBULATORY_CARE_PROVIDER_SITE_OTHER): Payer: Self-pay | Admitting: Family Medicine

## 2022-01-11 NOTE — Progress Notes (Unsigned)
Chief Complaint:   OBESITY Jaaziel is here to discuss his progress with his obesity treatment plan along with follow-up of his obesity related diagnoses. Jaeshawn is on the Category 4 Plan and states he is following his eating plan approximately 75% of the time. Khyson states he is exercising 0 minutes 0 times per week.  Today's visit was #: 19 Starting weight: 284 lbs Starting date: 04/22/2020 Today's weight: 265 lbs Today's date: 01/03/2022 Total lbs lost to date: 19 lbs Total lbs lost since last in-office visit: 6  Interim History: Marcques has done well with weight loss. Was able to be mindful over Thanksgiving holiday.  Subjective:   1. Hyperlipidemia, unspecified hyperlipidemia type Yaphet resumed statin, Pravachol--Denies any side effects.  2. Essential hypertension Iman's blood pressure is at goal on recheck today. Has not resumed Zestril--monitoring blood pressure at home and has been <130/80.  3. Emotional Eating Behaviors Lissandro is taking Wellbutrin, Zolft and has started Topamax. Denies any side effects. Topamax has helped curve appetite significantly.  Assessment/Plan:   1. Hyperlipidemia, unspecified hyperlipidemia type Will continue statin. Continue eating plan and exercise.  2. Essential hypertension Continue healthy eating plan and exercise to promote weight loss and monitor BP at home.  3. Emotional Eating Behaviors Continue taking Wellbutrin, Zoloft and Topamax. Continue healthy eating plan and exercise.  4. Obesity with current BMI of 36.0 Taedyn is currently in the action stage of change. As such, his goal is to continue with weight loss efforts. He has agreed to the Category 4 Plan.   Exercise goals: As is.  Behavioral modification strategies: increasing lean protein intake, decreasing simple carbohydrates, and decreasing alcohol intake.  Tyreek has agreed to follow-up with our clinic in 2 weeks. He was informed of the importance of frequent follow-up visits to  maximize his success with intensive lifestyle modifications for his multiple health conditions.   Objective:   Blood pressure (!) 153/89, pulse 64, temperature 97.7 F (36.5 C), height 6' (1.829 m), weight 265 lb (120.2 kg), SpO2 98 %. Body mass index is 35.94 kg/m.  General: Cooperative, alert, well developed, in no acute distress. HEENT: Conjunctivae and lids unremarkable. Cardiovascular: Regular rhythm.  Lungs: Normal work of breathing. Neurologic: No focal deficits.   Lab Results  Component Value Date   CREATININE 0.91 05/16/2021   BUN 19 05/16/2021   NA 140 05/16/2021   K 4.6 05/16/2021   CL 104 05/16/2021   CO2 24 05/16/2021   Lab Results  Component Value Date   ALT 22 05/16/2021   AST 19 05/16/2021   ALKPHOS 58 05/16/2021   BILITOT 0.5 05/16/2021   Lab Results  Component Value Date   HGBA1C 5.4 05/16/2021   HGBA1C 5.9 (H) 08/30/2020   HGBA1C 6.5 (H) 04/22/2020   Lab Results  Component Value Date   INSULIN 11.2 05/16/2021   INSULIN 12.5 08/30/2020   INSULIN 16.0 04/22/2020   Lab Results  Component Value Date   TSH 1.970 05/16/2021   Lab Results  Component Value Date   CHOL 175 05/16/2021   HDL 58 05/16/2021   LDLCALC 101 (H) 05/16/2021   TRIG 84 05/16/2021   CHOLHDL 3.0 05/16/2021   Lab Results  Component Value Date   VD25OH 38.0 05/16/2021   VD25OH 73.4 08/30/2020   VD25OH 41.9 04/22/2020   Lab Results  Component Value Date   WBC 8.6 04/22/2020   HGB 15.1 04/22/2020   HCT 44.0 04/22/2020   MCV 94 04/22/2020   PLT  309 04/22/2020   No results found for: "IRON", "TIBC", "FERRITIN"  Attestation Statements:   Reviewed by clinician on day of visit: allergies, medications, problem list, medical history, surgical history, family history, social history, and previous encounter notes.  Time spent on visit including pre-visit chart review and post-visit care and charting was 20 minutes.   I, Brendell Tyus, am acting as transcriptionist for  [employer], PA.  I have reviewed the above documentation for accuracy and completeness, and I agree with the above. -  ***

## 2022-01-11 NOTE — Telephone Encounter (Signed)
Pt called stating that he needs a refill of Topiramate. Pt's pharmacy states that the refill request was denied for our office. Patient states he was seen in our office on last week 11/28. Please call the patient at [phone] asap. [facility].

## 2022-01-12 ENCOUNTER — Ambulatory Visit (INDEPENDENT_AMBULATORY_CARE_PROVIDER_SITE_OTHER): Payer: BC Managed Care – PPO | Admitting: Family Medicine

## 2022-01-18 ENCOUNTER — Other Ambulatory Visit (INDEPENDENT_AMBULATORY_CARE_PROVIDER_SITE_OTHER): Payer: Self-pay | Admitting: Family Medicine

## 2022-01-18 DIAGNOSIS — F3289 Other specified depressive episodes: Secondary | ICD-10-CM

## 2022-01-23 ENCOUNTER — Telehealth (INDEPENDENT_AMBULATORY_CARE_PROVIDER_SITE_OTHER): Payer: BC Managed Care – PPO | Admitting: Physician Assistant

## 2022-01-23 DIAGNOSIS — E559 Vitamin D deficiency, unspecified: Secondary | ICD-10-CM

## 2022-01-23 DIAGNOSIS — E669 Obesity, unspecified: Secondary | ICD-10-CM | POA: Diagnosis not present

## 2022-01-23 DIAGNOSIS — F3289 Other specified depressive episodes: Secondary | ICD-10-CM | POA: Diagnosis not present

## 2022-01-23 DIAGNOSIS — Z6835 Body mass index (BMI) 35.0-35.9, adult: Secondary | ICD-10-CM | POA: Diagnosis not present

## 2022-01-23 MED ORDER — TOPIRAMATE 50 MG PO TABS: 50.0000 mg | ORAL_TABLET | Freq: Every day | ORAL | 0 refills | Status: DC

## 2022-01-23 MED ORDER — VITAMIN D (ERGOCALCIFEROL) 1.25 MG (50000 UNIT) PO CAPS: 50000.0000 [IU] | ORAL_CAPSULE | ORAL | 0 refills | Status: DC

## 2022-02-09 ENCOUNTER — Encounter (INDEPENDENT_AMBULATORY_CARE_PROVIDER_SITE_OTHER): Payer: Self-pay | Admitting: Physician Assistant

## 2022-02-09 NOTE — Progress Notes (Signed)
TeleHealth Visit:  Due to the COVID-19 pandemic, this visit was completed with telemedicine (audio/video) technology to reduce patient and provider exposure as well as to preserve personal protective equipment.   Ryan Hancock has verbally consented to this TeleHealth visit. The patient is located at home, the provider is located at the [facility] office. The participants in this visit include the listed provider and patient. The visit was conducted today via MyChart Video.    Chief Complaint: OBESITY Ryan Hancock is here to discuss his progress with his obesity treatment plan along with follow-up of his obesity related diagnoses. Ryan Hancock is on the Category 4 Plan and states he is following his eating plan approximately 50% of the time. Ryan Hancock states he is exercising 0 minutes 0 times per week.  Today's visit was #: 20 Starting weight: 284 lbs Starting date: 04/22/2020  Interim History: Ryan Hancock has been off eating plan over the past couple of weeks. Very busy with work/holidays and events with friends and family. Now struggling with URI and seen for video visit today. Working to get back on track with eating plan. Thinks he has gained ~7 lbs.  Subjective:   1. Vitamin D deficiency Ryan Hancock is currently taking prescription Vit D 50,000 IU once a week.  Denies any side effects.  2. Emotional Eating Behaviors Ryan Hancock is taking Wellbutrin XL 150 mg daily and Topiramate 50 mg. Feels it is helping to decrease craving-- needs refill for Topiramate--also feels curbs alcohol intake.  Assessment/Plan:   1. Vitamin D deficiency We will refill Vit D 50K IU once a week for 3 month with 0 refills.  -Refill Vitamin D, Ergocalciferol, (DRISDOL) 1.25 MG (50000 UNIT) CAPS capsule; Take 1 capsule (50,000 Units total) by mouth every 7 (seven) days.  Dispense: 12 capsule; Refill: 0  2. Emotional Eating Behaviors We will refill Topiramate 50 mg every night for 3 months with 0 refills.  -Refill topiramate (TOPAMAX)  50 MG tablet; Take 1 tablet (50 mg total) by mouth daily.  Dispense: 90 tablet; Refill: 0  3. Obesity, Current BMI of 35.9 Ryan Hancock is currently in the action stage of change. As such, his goal is to continue with weight loss efforts. He has agreed to the Category 4 Plan.   Exercise goals: All adults should avoid inactivity. Some physical activity is better than none, and adults who participate in any amount of physical activity gain some health benefits.  Behavioral modification strategies: increasing lean protein intake, decreasing simple carbohydrates, emotional eating strategies, and holiday eating strategies .  Ryan Hancock has agreed to follow-up with our clinic in 4 weeks. He was informed of the importance of frequent follow-up visits to maximize his success with intensive lifestyle modifications for his multiple health conditions.  Objective:   VITALS: Per patient if applicable, see vitals. GENERAL: Alert and in no acute distress. CARDIOPULMONARY: No increased WOB. Speaking in clear sentences.  PSYCH: Pleasant and cooperative. Speech normal rate and rhythm. Affect is appropriate. Insight and judgement are appropriate. Attention is focused, linear, and appropriate.  NEURO: Oriented as arrived to appointment on time with no prompting.   Lab Results  Component Value Date   CREATININE 0.91 05/16/2021   BUN 19 05/16/2021   NA 140 05/16/2021   K 4.6 05/16/2021   CL 104 05/16/2021   CO2 24 05/16/2021   Lab Results  Component Value Date   ALT 22 05/16/2021   AST 19 05/16/2021   ALKPHOS 58 05/16/2021   BILITOT 0.5 05/16/2021  Lab Results  Component Value Date   HGBA1C 5.4 05/16/2021   HGBA1C 5.9 (H) 08/30/2020   HGBA1C 6.5 (H) 04/22/2020   Lab Results  Component Value Date   INSULIN 11.2 05/16/2021   INSULIN 12.5 08/30/2020   INSULIN 16.0 04/22/2020   Lab Results  Component Value Date   TSH 1.970 05/16/2021   Lab Results  Component Value Date   CHOL 175 05/16/2021   HDL 58  05/16/2021   LDLCALC 101 (H) 05/16/2021   TRIG 84 05/16/2021   CHOLHDL 3.0 05/16/2021   Lab Results  Component Value Date   VD25OH 38.0 05/16/2021   VD25OH 73.4 08/30/2020   VD25OH 41.9 04/22/2020   Lab Results  Component Value Date   WBC 8.6 04/22/2020   HGB 15.1 04/22/2020   HCT 44.0 04/22/2020   MCV 94 04/22/2020   PLT 309 04/22/2020   No results found for: "IRON", "TIBC", "FERRITIN"  Attestation Statements:   Reviewed by clinician on day of visit: allergies, medications, problem list, medical history, surgical history, family history, social history, and previous encounter notes.  I, Brendell Tyus, am acting as transcriptionist for [employer], PA.  I have reviewed the above documentation for accuracy and completeness, and I agree with the above. - Izyan Ezzell,PA-C

## 2022-02-14 ENCOUNTER — Encounter (INDEPENDENT_AMBULATORY_CARE_PROVIDER_SITE_OTHER): Payer: Self-pay

## 2022-02-14 ENCOUNTER — Ambulatory Visit (INDEPENDENT_AMBULATORY_CARE_PROVIDER_SITE_OTHER): Payer: BC Managed Care – PPO | Admitting: Physician Assistant

## 2022-02-22 ENCOUNTER — Encounter (INDEPENDENT_AMBULATORY_CARE_PROVIDER_SITE_OTHER): Payer: Self-pay | Admitting: Physician Assistant

## 2022-02-22 ENCOUNTER — Ambulatory Visit (INDEPENDENT_AMBULATORY_CARE_PROVIDER_SITE_OTHER): Payer: BC Managed Care – PPO | Admitting: Physician Assistant

## 2022-02-22 VITALS — BP 150/76 | HR 71 | Temp 98.0°F | Ht 72.0 in | Wt 272.0 lb

## 2022-02-22 DIAGNOSIS — G4733 Obstructive sleep apnea (adult) (pediatric): Secondary | ICD-10-CM

## 2022-02-22 DIAGNOSIS — F3289 Other specified depressive episodes: Secondary | ICD-10-CM

## 2022-02-22 DIAGNOSIS — E1169 Type 2 diabetes mellitus with other specified complication: Secondary | ICD-10-CM

## 2022-02-22 DIAGNOSIS — Z6837 Body mass index (BMI) 37.0-37.9, adult: Secondary | ICD-10-CM

## 2022-02-22 DIAGNOSIS — E559 Vitamin D deficiency, unspecified: Secondary | ICD-10-CM | POA: Diagnosis not present

## 2022-02-22 DIAGNOSIS — E669 Obesity, unspecified: Secondary | ICD-10-CM

## 2022-02-27 DIAGNOSIS — E039 Hypothyroidism, unspecified: Secondary | ICD-10-CM | POA: Diagnosis not present

## 2022-02-27 DIAGNOSIS — E559 Vitamin D deficiency, unspecified: Secondary | ICD-10-CM | POA: Diagnosis not present

## 2022-02-27 DIAGNOSIS — I10 Essential (primary) hypertension: Secondary | ICD-10-CM | POA: Diagnosis not present

## 2022-02-27 DIAGNOSIS — F39 Unspecified mood [affective] disorder: Secondary | ICD-10-CM | POA: Diagnosis not present

## 2022-02-27 DIAGNOSIS — G4733 Obstructive sleep apnea (adult) (pediatric): Secondary | ICD-10-CM | POA: Diagnosis not present

## 2022-02-27 DIAGNOSIS — E1169 Type 2 diabetes mellitus with other specified complication: Secondary | ICD-10-CM | POA: Diagnosis not present

## 2022-03-01 NOTE — Progress Notes (Unsigned)
Chief Complaint:   OBESITY Ryan Hancock is here to discuss his progress with his obesity treatment plan along with follow-up of his obesity related diagnoses. Ryan Hancock is on the Category 4 Plan and states he is following his eating plan approximately 60% of the time. Ryan Hancock states he is biking 20 minutes 4-5 times per week.  Today's visit was #: 21 Starting weight: 284 lbs Starting date: 04/22/2020 Today's weight: 272 lbs Today's date: 02/22/2022 Total lbs lost to date: 12 lbs Total lbs lost since last in-office visit: 0  Interim History: Ryan Hancock reports he has struggled to stay on plan over the holidays and has been enjoying gatherings to watch [employer].  He reports increased snacking with the football games. He reports work has been extremely stressful as well. He has increased his exercising and has been riding his bike and may begin working on some strengthening for his upper body. He is going to see his primary care provider over the next week for his annual wellness visit and will have his annual labs done at this time.  Subjective:   1. Type 2 diabetes mellitus with other specified complication, without long-term current use of insulin (HCC) Ryan Hancock's last A1c was 5.4 on 05/16/21-at goal.  Insulin was 11.2-not at goal.  Working on decreasing simple carbs, increasing lean protein and exercise to promote weight loss and improve glycemic control.  Not currently on medications .  2. Vitamin D deficiency Ryan Hancock is taking ergocalciferol once weekly.  He notes fatigue.  Level of 38 on 05/16/21.  Reports no refills needed.  3. OSA (obstructive sleep apnea) Ryan Hancock is using CPAP nightly with good compliance-but endorses increased fatigue lately.  4. Emotional Eating Behaviors Ryan Hancock is taking Topamax 50 mg every night.   He felt it helped to decrease cravings and alcohol intake more when initially started taking-less so now.  Assessment/Plan:   1. Type 2 diabetes mellitus with other specified  complication, without long-term current use of insulin (HCC) Continue Prescribed Nutrition Plan and exercise to promote weight loss and improve glycemic control.  Will check labs if not done by PCP at [facility].  2. Vitamin D deficiency Continue ergocalciferol once a week.  Will check labs if not done by PCP at [facility].  3. OSA (obstructive sleep apnea) Discuss fatigue with PCP at visit next week.  Will check labs  if not done by PCP at [facility].  4. Emotional Eating Behaviors Dicussed increasing topamax to 75 mg every night, but he would like to hold off increasing for now.  Continues to work on emotional eating strategies.  5. Obesity, Current BMI of 37.0 Ryan Hancock is currently in the action stage of change. As such, his goal is to continue with weight loss efforts. He has agreed to the Category 4 Plan.   Exercise goals: As is.  Adding upper body strengthening.  Behavioral modification strategies: increasing lean protein intake, decreasing simple carbohydrates, and planning for success.  Ryan Hancock has agreed to follow-up with our clinic in 3 weeks. He was informed of the importance of frequent follow-up visits to maximize his success with intensive lifestyle modifications for his multiple health conditions.   Objective:   Blood pressure (!) 150/76, pulse 71, temperature 98 F (36.7 C), height 6' (1.829 m), weight 272 lb (123.4 kg), SpO2 97 %. Body mass index is 36.89 kg/m.  General: Cooperative, alert, well developed, in no acute distress. HEENT: Conjunctivae and lids unremarkable. Cardiovascular: Regular rhythm.  Lungs: Normal work of breathing.  Neurologic: No focal deficits.   Lab Results  Component Value Date   CREATININE 0.91 05/16/2021   BUN 19 05/16/2021   NA 140 05/16/2021   K 4.6 05/16/2021   CL 104 05/16/2021   CO2 24 05/16/2021   Lab Results  Component Value Date   ALT 22 05/16/2021   AST 19 05/16/2021   ALKPHOS 58 05/16/2021   BILITOT 0.5 05/16/2021   Lab Results  Component  Value Date   HGBA1C 5.4 05/16/2021   HGBA1C 5.9 (H) 08/30/2020   HGBA1C 6.5 (H) 04/22/2020   Lab Results  Component Value Date   INSULIN 11.2 05/16/2021   INSULIN 12.5 08/30/2020   INSULIN 16.0 04/22/2020   Lab Results  Component Value Date   TSH 1.970 05/16/2021   Lab Results  Component Value Date   CHOL 175 05/16/2021   HDL 58 05/16/2021   LDLCALC 101 (H) 05/16/2021   TRIG 84 05/16/2021   CHOLHDL 3.0 05/16/2021   Lab Results  Component Value Date   VD25OH 38.0 05/16/2021   VD25OH 73.4 08/30/2020   VD25OH 41.9 04/22/2020   Lab Results  Component Value Date   WBC 8.6 04/22/2020   HGB 15.1 04/22/2020   HCT 44.0 04/22/2020   MCV 94 04/22/2020   PLT 309 04/22/2020   No results found for: "IRON", "TIBC", "FERRITIN"  Attestation Statements:   Reviewed by clinician on day of visit: allergies, medications, problem list, medical history, surgical history, family history, social history, and previous encounter notes.  I, Ryan Hancock, am acting as transcriptionist for [employer], PA.  I have reviewed the above documentation for accuracy and completeness, and I agree with the above. -  Ryan Orlick,PA-C

## 2022-03-13 ENCOUNTER — Encounter (INDEPENDENT_AMBULATORY_CARE_PROVIDER_SITE_OTHER): Payer: Self-pay | Admitting: Physician Assistant

## 2022-03-13 ENCOUNTER — Ambulatory Visit (INDEPENDENT_AMBULATORY_CARE_PROVIDER_SITE_OTHER): Payer: BC Managed Care – PPO | Admitting: Physician Assistant

## 2022-03-13 VITALS — BP 121/80 | HR 74 | Temp 98.0°F | Ht 72.0 in | Wt 265.4 lb

## 2022-03-13 DIAGNOSIS — E1169 Type 2 diabetes mellitus with other specified complication: Secondary | ICD-10-CM | POA: Diagnosis not present

## 2022-03-13 DIAGNOSIS — I10 Essential (primary) hypertension: Secondary | ICD-10-CM | POA: Diagnosis not present

## 2022-03-13 DIAGNOSIS — Z6836 Body mass index (BMI) 36.0-36.9, adult: Secondary | ICD-10-CM | POA: Insufficient documentation

## 2022-03-13 DIAGNOSIS — F3289 Other specified depressive episodes: Secondary | ICD-10-CM | POA: Diagnosis not present

## 2022-03-13 DIAGNOSIS — F32A Depression, unspecified: Secondary | ICD-10-CM | POA: Insufficient documentation

## 2022-03-22 NOTE — Progress Notes (Signed)
Chief Complaint:   OBESITY Ryan Hancock is here to discuss his progress with his obesity treatment plan along with follow-up of his obesity related diagnoses. Daqwan is on the Category 4 Plan and states he is following his eating plan approximately 90% of the time. Burris states he is biking 30 minutes 4 times per week.  Today's visit was #: 22 Starting weight: 284 lbs Starting date: 04/22/2020 Today's weight: 265 lbs Today's date: 03/13/2022 Total lbs lost to date: 19 lbs Total lbs lost since last in-office visit: 7  Interim History: Ryan Hancock has done well with weight loss. He saw his PCP recently and A1c and BP were elevated at that time and this motivated him to work hard on his nutrition plan and exercise.  He has also stopped any alcohol intake for the past 2 weeks. Hunger is controlled. Appetite not excessive.  Stress level at work remains high. He is walking more and riding bike 20-30 minutes 4 times weekly.   Subjective:   1. Type 2 diabetes mellitus with other specified complication, without long-term current use of insulin ([place]) On 02/28/22, A1c was 5.8-increased from previous.  Not on medications.  Saw Dr. Loralie Champagne.  Working on decreasing simple carbs, increasing lean protein and exercise to promote weight loss and improve glycemic control.  Stopped all alcohol for 2 weeks.  2. Essential hypertension Blood pressure at goal today.  On Zestril 11m daily.  Denies any side effects. Renal function is normal.  3. Emotional Eating Behaviors On Topiramate but taking in morning and reports feels tired during day-Advised to change dose to every night.  Continue Wellbutrin XL 150 mg daily and Zoloft 100 mg daily.  Assessment/Plan:   1. Type 2 diabetes mellitus with other specified complication, without long-term current use of insulin (HCC) Continue Prescribed Nutrition Plan and exercise to promote weight loss/ and improve glycemic control.  2. Essential hypertension Continue Prescribed  Nutrition Plan and exercise to promote weight loss/increase blood pressure control.  Continue Zestril.  3. Emotional Eating Behaviors Continue Topiramate 50 mg but change to every night.  Monitor-no refills needed today.  4. BMI 36.0-36.9,adult, Current BMI 36.0  5. Morbid obesity (HCC)-start bmi 38.52 HBralynis currently in the action stage of change. As such, his goal is to continue with weight loss efforts. He has agreed to the Category 4 Plan.   Exercise goals: As is.  Behavioral modification strategies: increasing lean protein intake, decreasing simple carbohydrates, decreasing alcohol intake, emotional eating strategies, and celebration eating strategies.  HGeorgioshas agreed to follow-up with our clinic in 4 weeks. He was informed of the importance of frequent follow-up visits to maximize his success with intensive lifestyle modifications for his multiple health conditions.   Objective:   Blood pressure 121/80, pulse 74, temperature 98 F (36.7 C), height 6' (1.829 m), weight 265 lb 6.4 oz (120.4 kg), SpO2 96 %. Body mass index is 35.99 kg/m.  General: Cooperative, alert, well developed, in no acute distress. HEENT: Conjunctivae and lids unremarkable. Cardiovascular: Regular rhythm.  Lungs: Normal work of breathing. Neurologic: No focal deficits.   Lab Results  Component Value Date   CREATININE 0.91 05/16/2021   BUN 19 05/16/2021   NA 140 05/16/2021   K 4.6 05/16/2021   CL 104 05/16/2021   CO2 24 05/16/2021   Lab Results  Component Value Date   ALT 22 05/16/2021   AST 19 05/16/2021   ALKPHOS 58 05/16/2021   BILITOT 0.5 05/16/2021   Lab Results  Component Value Date   HGBA1C 5.4 05/16/2021   HGBA1C 5.9 (H) 08/30/2020   HGBA1C 6.5 (H) 04/22/2020   Lab Results  Component Value Date   INSULIN 11.2 05/16/2021   INSULIN 12.5 08/30/2020   INSULIN 16.0 04/22/2020   Lab Results  Component Value Date   TSH 1.970 05/16/2021   Lab Results  Component Value Date    CHOL 175 05/16/2021   HDL 58 05/16/2021   LDLCALC 101 (H) 05/16/2021   TRIG 84 05/16/2021   CHOLHDL 3.0 05/16/2021   Lab Results  Component Value Date   VD25OH 38.0 05/16/2021   VD25OH 73.4 08/30/2020   VD25OH 41.9 04/22/2020   Lab Results  Component Value Date   WBC 8.6 04/22/2020   HGB 15.1 04/22/2020   HCT 44.0 04/22/2020   MCV 94 04/22/2020   PLT 309 04/22/2020   No results found for: "IRON", "TIBC", "FERRITIN"  Attestation Statements:   Reviewed by clinician on day of visit: allergies, medications, problem list, medical history, surgical history, family history, social history, and previous encounter notes.  Time spent on visit including pre-visit chart review and post-visit care and charting was 30 minutes.   I, Brendell Tyus, am acting as transcriptionist for [employer], [employer].  I have reviewed the above documentation for accuracy and completeness, and I agree with the above. -  Jaree Dwight,PA-C

## 2022-04-10 ENCOUNTER — Ambulatory Visit (INDEPENDENT_AMBULATORY_CARE_PROVIDER_SITE_OTHER): Payer: BC Managed Care – PPO | Admitting: Physician Assistant

## 2022-05-01 ENCOUNTER — Other Ambulatory Visit (INDEPENDENT_AMBULATORY_CARE_PROVIDER_SITE_OTHER): Payer: Self-pay | Admitting: Physician Assistant

## 2022-05-01 DIAGNOSIS — F3289 Other specified depressive episodes: Secondary | ICD-10-CM

## 2022-06-22 DIAGNOSIS — R109 Unspecified abdominal pain: Secondary | ICD-10-CM | POA: Diagnosis not present

## 2022-06-22 DIAGNOSIS — R197 Diarrhea, unspecified: Secondary | ICD-10-CM | POA: Diagnosis not present

## 2022-08-04 DIAGNOSIS — G5601 Carpal tunnel syndrome, right upper limb: Secondary | ICD-10-CM | POA: Diagnosis not present

## 2022-08-08 DIAGNOSIS — G5601 Carpal tunnel syndrome, right upper limb: Secondary | ICD-10-CM | POA: Diagnosis not present

## 2022-08-09 DIAGNOSIS — M25551 Pain in right hip: Secondary | ICD-10-CM | POA: Diagnosis not present

## 2022-08-17 DIAGNOSIS — G5601 Carpal tunnel syndrome, right upper limb: Secondary | ICD-10-CM | POA: Diagnosis not present

## 2022-08-29 DIAGNOSIS — E559 Vitamin D deficiency, unspecified: Secondary | ICD-10-CM | POA: Diagnosis not present

## 2022-08-29 DIAGNOSIS — Z0001 Encounter for general adult medical examination with abnormal findings: Secondary | ICD-10-CM | POA: Diagnosis not present

## 2022-08-29 DIAGNOSIS — E119 Type 2 diabetes mellitus without complications: Secondary | ICD-10-CM | POA: Diagnosis not present

## 2022-08-29 DIAGNOSIS — E039 Hypothyroidism, unspecified: Secondary | ICD-10-CM | POA: Diagnosis not present

## 2022-08-29 DIAGNOSIS — I10 Essential (primary) hypertension: Secondary | ICD-10-CM | POA: Diagnosis not present

## 2022-08-29 DIAGNOSIS — E781 Pure hyperglyceridemia: Secondary | ICD-10-CM | POA: Diagnosis not present

## 2022-09-04 DIAGNOSIS — G4733 Obstructive sleep apnea (adult) (pediatric): Secondary | ICD-10-CM | POA: Diagnosis not present

## 2022-09-04 DIAGNOSIS — R454 Irritability and anger: Secondary | ICD-10-CM | POA: Diagnosis not present

## 2022-09-04 DIAGNOSIS — F1020 Alcohol dependence, uncomplicated: Secondary | ICD-10-CM | POA: Diagnosis not present

## 2022-09-06 DIAGNOSIS — M25551 Pain in right hip: Secondary | ICD-10-CM | POA: Diagnosis not present

## 2022-09-11 DIAGNOSIS — I10 Essential (primary) hypertension: Secondary | ICD-10-CM | POA: Diagnosis not present

## 2022-09-11 DIAGNOSIS — F1010 Alcohol abuse, uncomplicated: Secondary | ICD-10-CM | POA: Diagnosis not present

## 2022-09-11 DIAGNOSIS — R454 Irritability and anger: Secondary | ICD-10-CM | POA: Diagnosis not present

## 2022-10-16 DIAGNOSIS — F1010 Alcohol abuse, uncomplicated: Secondary | ICD-10-CM | POA: Diagnosis not present

## 2022-10-16 DIAGNOSIS — E039 Hypothyroidism, unspecified: Secondary | ICD-10-CM | POA: Diagnosis not present

## 2022-10-16 DIAGNOSIS — E1169 Type 2 diabetes mellitus with other specified complication: Secondary | ICD-10-CM | POA: Diagnosis not present

## 2022-10-16 DIAGNOSIS — F332 Major depressive disorder, recurrent severe without psychotic features: Secondary | ICD-10-CM | POA: Diagnosis not present

## 2022-10-23 DIAGNOSIS — F332 Major depressive disorder, recurrent severe without psychotic features: Secondary | ICD-10-CM | POA: Diagnosis not present

## 2022-10-23 DIAGNOSIS — R2241 Localized swelling, mass and lump, right lower limb: Secondary | ICD-10-CM | POA: Diagnosis not present

## 2022-10-23 DIAGNOSIS — F1010 Alcohol abuse, uncomplicated: Secondary | ICD-10-CM | POA: Diagnosis not present

## 2022-10-23 DIAGNOSIS — G4733 Obstructive sleep apnea (adult) (pediatric): Secondary | ICD-10-CM | POA: Diagnosis not present

## 2022-11-08 DIAGNOSIS — G4733 Obstructive sleep apnea (adult) (pediatric): Secondary | ICD-10-CM | POA: Diagnosis not present

## 2022-12-27 DIAGNOSIS — M25552 Pain in left hip: Secondary | ICD-10-CM | POA: Diagnosis not present

## 2022-12-27 DIAGNOSIS — E1169 Type 2 diabetes mellitus with other specified complication: Secondary | ICD-10-CM | POA: Diagnosis not present

## 2022-12-27 DIAGNOSIS — G8929 Other chronic pain: Secondary | ICD-10-CM | POA: Diagnosis not present

## 2022-12-27 DIAGNOSIS — F419 Anxiety disorder, unspecified: Secondary | ICD-10-CM | POA: Diagnosis not present

## 2022-12-27 DIAGNOSIS — F339 Major depressive disorder, recurrent, unspecified: Secondary | ICD-10-CM | POA: Diagnosis not present

## 2022-12-27 DIAGNOSIS — I10 Essential (primary) hypertension: Secondary | ICD-10-CM | POA: Diagnosis not present

## 2023-01-01 DIAGNOSIS — F339 Major depressive disorder, recurrent, unspecified: Secondary | ICD-10-CM | POA: Diagnosis not present

## 2023-01-25 DIAGNOSIS — F339 Major depressive disorder, recurrent, unspecified: Secondary | ICD-10-CM | POA: Diagnosis not present

## 2023-01-25 DIAGNOSIS — F419 Anxiety disorder, unspecified: Secondary | ICD-10-CM | POA: Diagnosis not present

## 2023-03-13 NOTE — Patient Instructions (Addendum)
 SURGICAL WAITING ROOM VISITATION Patients having surgery or a procedure may have no more than 2 support people in the waiting area - these visitors may rotate.    Children under the age of 34 must have an adult with them who is not the patient.  Due to an increase in RSV and influenza rates and associated hospitalizations, children ages 40 and under may not visit patients in [facility] hospitals.   If the patient needs to stay at the hospital during part of their recovery, the visitor guidelines for inpatient rooms apply. Pre-op nurse will coordinate an appropriate time for 1 support person to accompany patient in pre-op.  This support person may not rotate.    Please refer to the [facility] website for the visitor guidelines for Inpatients (after your surgery is over and you are in a regular room).       Your procedure is scheduled on: 03-29-23   Report to [facility] Main Entrance    Report to admitting at 5:45 AM   Call this number if you have problems the morning of surgery [phone]   Do not eat food :After Midnight.   After Midnight you may have the following liquids until 5:15 AM DAY OF SURGERY  Water  Non-Citrus Juices (without pulp, NO RED-Apple, White grape, White cranberry) Black Coffee (NO MILK/CREAM OR CREAMERS, sugar ok)  Clear Tea (NO MILK/CREAM OR CREAMERS, sugar ok) regular and decaf                             Plain Jell-O (NO RED)                                           Fruit ices (not with fruit pulp, NO RED)                                     Popsicles (NO RED)                                                               Sports drinks like Gatorade (NO RED)                   The day of surgery:  Drink ONE (1) Pre-Surgery  G2 by 5:15 AM the morning of surgery. Drink in one sitting. Do not sip.  This drink was given to you during your hospital pre-op appointment visit. Nothing else to drink after completing the Pre-Surgery G2.          If  you have questions, please contact your surgeon's office.   FOLLOW  ANY ADDITIONAL PRE OP INSTRUCTIONS YOU RECEIVED FROM YOUR SURGEON'S OFFICE!!!     Oral Hygiene is also important to reduce your risk of infection.                                    Remember - BRUSH YOUR TEETH THE MORNING OF SURGERY WITH YOUR REGULAR TOOTHPASTE   Do  NOT smoke after Midnight   Take these medicines the morning of surgery with A SIP OF WATER :   Bupropion   Levothyroxine   Pravastatin   Tamsulosin   Vraylar   Stop all vitamins and herbal supplements 7 days before surgery  Bring CPAP mask and tubing day of surgery.                              You may not have any metal on your body including  jewelry, and body piercing             Do not wear  lotions, powders,  cologne, or deodorant              Men may shave face and neck.   Do not bring valuables to the hospital. [facility] IS NOT RESPONSIBLE   FOR VALUABLES.   Contacts, dentures or bridgework may not be worn into surgery.   Bring small overnight bag day of surgery.   DO NOT BRING YOUR HOME MEDICATIONS TO THE HOSPITAL. PHARMACY WILL DISPENSE MEDICATIONS LISTED ON YOUR MEDICATION LIST TO YOU DURING YOUR ADMISSION IN THE HOSPITAL!     Special Instructions: Bring a copy of your healthcare power of attorney and living will documents the day of surgery if you haven't scanned them before.              Please read over the following fact sheets you were given: IF YOU HAVE QUESTIONS ABOUT YOUR PRE-OP INSTRUCTIONS PLEASE CALL [phone] G[place]  If you received a COVID test during your pre-op visit  it is requested that you wear a mask when out in public, stay away from anyone that may not be feeling well and notify your surgeon if you develop symptoms. If you test positive for Covid or have been in contact with anyone that has tested positive in the last 10 days please notify you surgeon.    Pre-operative 5 CHG Bath Instructions   You can play a key  role in reducing the risk of infection after surgery. Your skin needs to be as free of germs as possible. You can reduce the number of germs on your skin by washing with CHG (chlorhexidine  gluconate) soap before surgery. CHG is an antiseptic soap that kills germs and continues to kill germs even after washing.   DO NOT use if you have an allergy to chlorhexidine /CHG or antibacterial soaps. If your skin becomes reddened or irritated, stop using the CHG and notify one of our RNs at [phone].   Please shower with the CHG soap starting 4 days before surgery using the following schedule:     Please keep in mind the following:  DO NOT shave, including legs and underarms, starting the day of your first shower.   You may shave your face at any point before/day of surgery.  Place clean sheets on your bed the day you start using CHG soap. Use a clean washcloth (not used since being washed) for each shower. DO NOT sleep with pets once you start using the CHG.   CHG Shower Instructions:  If you choose to wash your hair and private area, wash first with your normal shampoo/soap.  After you use shampoo/soap, rinse your hair and body thoroughly to remove shampoo/soap residue.  Turn the water  OFF and apply about 3 tablespoons (45 ml) of CHG soap to a CLEAN washcloth.  Apply CHG soap ONLY FROM YOUR NECK DOWN TO YOUR TOES (washing for  3-5 minutes)  DO NOT use CHG soap on face, private areas, open wounds, or sores.  Pay special attention to the area where your surgery is being performed.  If you are having back surgery, having someone wash your back for you may be helpful. Wait 2 minutes after CHG soap is applied, then you may rinse off the CHG soap.  Pat dry with a clean towel  Put on clean clothes/pajamas   If you choose to wear lotion, please use ONLY the CHG-compatible lotions on the back of this paper.     Additional instructions for the day of surgery: DO NOT APPLY any lotions, deodorants,  cologne, or perfumes.   Put on clean/comfortable clothes.  Brush your teeth.  Ask your nurse before applying any prescription medications to the skin.      CHG Compatible Lotions   Aveeno Moisturizing lotion  Cetaphil Moisturizing Cream  Cetaphil Moisturizing Lotion  Clairol Herbal Essence Moisturizing Lotion, Dry Skin  Clairol Herbal Essence Moisturizing Lotion, Extra Dry Skin  Clairol Herbal Essence Moisturizing Lotion, Normal Skin  Curel Age Defying Therapeutic Moisturizing Lotion with Alpha Hydroxy  Curel Extreme Care Body Lotion  Curel Soothing Hands Moisturizing Hand Lotion  Curel Therapeutic Moisturizing Cream, Fragrance-Free  Curel Therapeutic Moisturizing Lotion, Fragrance-Free  Curel Therapeutic Moisturizing Lotion, Original Formula  Eucerin Daily Replenishing Lotion  Eucerin Dry Skin Therapy Plus Alpha Hydroxy Crme  Eucerin Dry Skin Therapy Plus Alpha Hydroxy Lotion  Eucerin Original Crme  Eucerin Original Lotion  Eucerin Plus Crme Eucerin Plus Lotion  Eucerin TriLipid Replenishing Lotion  Keri Anti-Bacterial Hand Lotion  Keri Deep Conditioning Original Lotion Dry Skin Formula Softly Scented  Keri Deep Conditioning Original Lotion, Fragrance Free Sensitive Skin Formula  Keri Lotion Fast Absorbing Fragrance Free Sensitive Skin Formula  Keri Lotion Fast Absorbing Softly Scented Dry Skin Formula  Keri Original Lotion  Keri Skin Renewal Lotion Keri Silky Smooth Lotion  Keri Silky Smooth Sensitive Skin Lotion  Nivea Body Creamy Conditioning Oil  Nivea Body Extra Enriched Lotion  Nivea Body Original Lotion  Nivea Body Sheer Moisturizing Lotion Nivea Crme  Nivea Skin Firming Lotion  NutraDerm 30 Skin Lotion  NutraDerm Skin Lotion  NutraDerm Therapeutic Skin Cream  NutraDerm Therapeutic Skin Lotion  ProShield Protective Hand Cream  Provon moisturizing lotion   PATIENT SIGNATURE_________________________________  NURSE  SIGNATURE__________________________________  ________________________________________________________________________    Ryan Hancock  An incentive spirometer is a tool that can help keep your lungs clear and active. This tool measures how well you are filling your lungs with each breath. Taking long deep breaths may help reverse or decrease the chance of developing breathing (pulmonary) problems (especially infection) following: A long period of time when you are unable to move or be active. BEFORE THE PROCEDURE  If the spirometer includes an indicator to show your best effort, your nurse or respiratory therapist will set it to a desired goal. If possible, sit up straight or lean slightly forward. Try not to slouch. Hold the incentive spirometer in an upright position. INSTRUCTIONS FOR USE  Sit on the edge of your bed if possible, or sit up as far as you can in bed or on a chair. Hold the incentive spirometer in an upright position. Breathe out normally. Place the mouthpiece in your mouth and seal your lips tightly around it. Breathe in slowly and as deeply as possible, raising the piston or the ball toward the top of the column. Hold your breath for 3-5 seconds or for as long as  possible. Allow the piston or ball to fall to the bottom of the column. Remove the mouthpiece from your mouth and breathe out normally. Rest for a few seconds and repeat Steps 1 through 7 at least 10 times every 1-2 hours when you are awake. Take your time and take a few normal breaths between deep breaths. The spirometer may include an indicator to show your best effort. Use the indicator as a goal to work toward during each repetition. After each set of 10 deep breaths, practice coughing to be sure your lungs are clear. If you have an incision (the cut made at the time of surgery), support your incision when coughing by placing a pillow or rolled up towels firmly against it. Once you are able to get out of  bed, walk around indoors and cough well. You may stop using the incentive spirometer when instructed by your caregiver.  RISKS AND COMPLICATIONS Take your time so you do not get dizzy or light-headed. If you are in pain, you may need to take or ask for pain medication before doing incentive spirometry. It is harder to take a deep breath if you are having pain. AFTER USE Rest and breathe slowly and easily. It can be helpful to keep track of a log of your progress. Your caregiver can provide you with a simple table to help with this. If you are using the spirometer at home, follow these instructions: SEEK MEDICAL CARE IF:  You are having difficultly using the spirometer. You have trouble using the spirometer as often as instructed. Your pain medication is not giving enough relief while using the spirometer. You develop fever of 100.5 F (38.1 C) or higher. SEEK IMMEDIATE MEDICAL CARE IF:  You cough up bloody sputum that had not been present before. You develop fever of 102 F (38.9 C) or greater. You develop worsening pain at or near the incision site. MAKE SURE YOU:  Understand these instructions. Will watch your condition. Will get help right away if you are not doing well or get worse. Document Released: 06/05/2006 Document Revised: 04/17/2011 Document Reviewed: 08/06/2006 ExitCare Patient Information 2014 Exit[employer], [place].   ________________________________________________________________________ WHAT IS A BLOOD TRANSFUSION? Blood Transfusion Information  A transfusion is the replacement of blood or some of its parts. Blood is made up of multiple cells which provide different functions. Red blood cells carry oxygen and are used for blood loss replacement. White blood cells fight against infection. Platelets control bleeding. Plasma helps clot blood. Other blood products are available for specialized needs, such as hemophilia or other clotting disorders. BEFORE THE TRANSFUSION   Who gives blood for transfusions?  Healthy volunteers who are fully evaluated to make sure their blood is safe. This is blood bank blood. Transfusion therapy is the safest it has ever been in the practice of medicine. Before blood is taken from a donor, a complete history is taken to make sure that person has no history of diseases nor engages in risky social behavior (examples are intravenous drug use or sexual activity with multiple partners). The donor's travel history is screened to minimize risk of transmitting infections, such as malaria. The donated blood is tested for signs of infectious diseases, such as HIV and hepatitis. The blood is then tested to be sure it is compatible with you in order to minimize the chance of a transfusion reaction. If you or a relative donates blood, this is often done in anticipation of surgery and is not appropriate for emergency situations. It  takes many days to process the donated blood. RISKS AND COMPLICATIONS Although transfusion therapy is very safe and saves many lives, the main dangers of transfusion include:  Getting an infectious disease. Developing a transfusion reaction. This is an allergic reaction to something in the blood you were given. Every precaution is taken to prevent this. The decision to have a blood transfusion has been considered carefully by your caregiver before blood is given. Blood is not given unless the benefits outweigh the risks. AFTER THE TRANSFUSION Right after receiving a blood transfusion, you will usually feel much better and more energetic. This is especially true if your red blood cells have gotten low (anemic). The transfusion raises the level of the red blood cells which carry oxygen, and this usually causes an energy increase. The nurse administering the transfusion will monitor you carefully for complications. HOME CARE INSTRUCTIONS  No special instructions are needed after a transfusion. You may find your energy is  better. Speak with your caregiver about any limitations on activity for underlying diseases you may have. SEEK MEDICAL CARE IF:  Your condition is not improving after your transfusion. You develop redness or irritation at the intravenous (IV) site. SEEK IMMEDIATE MEDICAL CARE IF:  Any of the following symptoms occur over the next 12 hours: Shaking chills. You have a temperature by mouth above 102 F (38.9 C), not controlled by medicine. Chest, back, or muscle pain. People around you feel you are not acting correctly or are confused. Shortness of breath or difficulty breathing. Dizziness and fainting. You get a rash or develop hives. You have a decrease in urine output. Your urine turns a dark color or changes to pink, red, or brown. Any of the following symptoms occur over the next 10 days: You have a temperature by mouth above 102 F (38.9 C), not controlled by medicine. Shortness of breath. Weakness after normal activity. The white part of the eye turns yellow (jaundice). You have a decrease in the amount of urine or are urinating less often. Your urine turns a dark color or changes to pink, red, or brown. Document Released: 01/21/2000 Document Revised: 04/17/2011 Document Reviewed: 09/09/2007 [facility] Patient Information 2014 [place], [place].  _______________________________________________________________________

## 2023-03-15 NOTE — Progress Notes (Addendum)
 COVID Vaccine Completed:  Yes  Date of COVID positive in last 90 days:  No  PCP - Darice Henle, MD Cardiologist - N/A  Chest x-ray - N/A EKG - 03-16-23 Epic Stress Test - N/A ECHO - N/A Cardiac Cath - N/A Pacemaker/ICD device last checked: Spinal Cord Stimulator:N/A  Bowel Prep - N/A  Sleep Study - Yes, +sleep apnea CPAP - Yes  Fasting Blood Sugar -  Checks Blood Sugar - does not check, 164 at PAT  Last dose of GLP1 agonist-  N/A GLP1 instructions:  Hold 7 days before surgery    Last dose of SGLT-2 inhibitors-  N/A SGLT-2 instructions:  Hold 3 days before surgery   Blood Thinner Instructions:  N/A Aspirin  Instructions: Last Dose:  Activity level:  Can go up a flight of stairs and perform activities of daily living without stopping and without symptoms of chest pain or shortness of breath.  Anesthesia review: N/A  Patient denies shortness of breath, fever, cough and chest pain at PAT appointment  Patient verbalized understanding of instructions that were given to them at the PAT appointment. Patient was also instructed that they will need to review over the PAT instructions again at home before surgery.

## 2023-03-16 ENCOUNTER — Encounter (HOSPITAL_COMMUNITY): Payer: Self-pay

## 2023-03-16 ENCOUNTER — Other Ambulatory Visit: Payer: Self-pay

## 2023-03-16 ENCOUNTER — Encounter (HOSPITAL_COMMUNITY)
Admission: RE | Admit: 2023-03-16 | Discharge: 2023-03-16 | Disposition: A | Discharge status: Home / Self Care | Payer: BC Managed Care – PPO | Source: Ambulatory Visit | Attending: Orthopedic Surgery | Admitting: Orthopedic Surgery

## 2023-03-16 VITALS — BP 147/91 | HR 83 | Temp 98.1°F | Resp 16 | Ht 73.0 in | Wt 270.2 lb

## 2023-03-16 DIAGNOSIS — Z01818 Encounter for other preprocedural examination: Secondary | ICD-10-CM | POA: Diagnosis present

## 2023-03-16 DIAGNOSIS — M1611 Unilateral primary osteoarthritis, right hip: Secondary | ICD-10-CM | POA: Diagnosis not present

## 2023-03-16 DIAGNOSIS — E119 Type 2 diabetes mellitus without complications: Secondary | ICD-10-CM | POA: Insufficient documentation

## 2023-03-16 DIAGNOSIS — I10 Essential (primary) hypertension: Secondary | ICD-10-CM | POA: Insufficient documentation

## 2023-03-16 HISTORY — DX: Type 2 diabetes mellitus without complications: E11.9

## 2023-03-16 LAB — SURGICAL PCR SCREEN
MRSA, PCR: NEGATIVE
Staphylococcus aureus: NEGATIVE

## 2023-03-16 LAB — BASIC METABOLIC PANEL
Anion gap: 11 (ref 5–15)
BUN: 13 mg/dL (ref 8–23)
CO2: 24 mmol/L (ref 22–32)
Calcium: 9.3 mg/dL (ref 8.9–10.3)
Chloride: 103 mmol/L (ref 98–111)
Creatinine, Ser: 0.76 mg/dL (ref 0.61–1.24)
GFR, Estimated: >60 mL/min (ref >60)
Glucose, Bld: 148 mg/dL — ABNORMAL HIGH (ref 70–99)
Potassium: 3.8 mmol/L (ref 3.5–5.1)
Sodium: 138 mmol/L (ref 135–145)

## 2023-03-16 LAB — CBC
HCT: 44.4 % (ref 39.0–52.0)
Hemoglobin: 15.1 g/dL (ref 13.0–17.0)
MCH: 31 pg (ref 26.0–34.0)
MCHC: 34 g/dL (ref 30.0–36.0)
MCV: 91.2 fL (ref 80.0–100.0)
Platelets: 306 10*3/uL (ref 150–400)
RBC: 4.87 MIL/uL (ref 4.22–5.81)
RDW: 12 % (ref 11.5–15.5)
WBC: 8.7 10*3/uL (ref 4.0–10.5)
nRBC: 0 % (ref 0.0–0.2)

## 2023-03-16 LAB — TYPE AND SCREEN
ABO/RH(D): A POS
Antibody Screen: NEGATIVE

## 2023-03-16 LAB — GLUCOSE, CAPILLARY: Glucose-Capillary: 164 mg/dL — ABNORMAL HIGH (ref 70–99)

## 2023-03-16 LAB — HEMOGLOBIN A1C
Hgb A1c MFr Bld: 5.9 % — ABNORMAL HIGH (ref 4.8–5.6)
Mean Plasma Glucose: 122.63 mg/dL

## 2023-03-28 NOTE — Anesthesia Preprocedure Evaluation (Signed)
 Anesthesia Evaluation  Patient identified by MRN, date of birth, ID band Patient awake    Reviewed: Allergy & Precautions, NPO status , Patient's Chart, lab work & pertinent test results  History of Anesthesia Complications Negative for: history of anesthetic complications  Airway Mallampati: III  TM Distance: >3 FB Neck ROM: Full    Dental  (+) Dental Advisory Given,    Pulmonary neg shortness of breath, sleep apnea and Continuous Positive Airway Pressure Ventilation , neg COPD, neg recent URI, former smoker   Pulmonary exam normal breath sounds clear to auscultation       Cardiovascular hypertension (lisinopril), Pt. on medications (-) angina (-) Past MI, (-) Cardiac Stents and (-) CABG (-) dysrhythmias  Rhythm:Regular Rate:Normal  HLD   Neuro/Psych Seizures - (several years ago due to hypoglycemia),  PSYCHIATRIC DISORDERS Anxiety Depression       GI/Hepatic negative GI ROS, Neg liver ROS,,,  Endo/Other  diabetes (Hgb A1c 5.9), Well Controlled, Type 2Hypothyroidism    Renal/GU negative Renal ROS     Musculoskeletal  (+) Arthritis , Osteoarthritis,    Abdominal  (+) + obese  Peds  Hematology negative hematology ROS (+) Lab Results      Component                Value               Date                      WBC                      8.7                 03/16/2023                HGB                      15.1                03/16/2023                HCT                      44.4                03/16/2023                MCV                      91.2                03/16/2023                PLT                      306                 03/16/2023              Anesthesia Other Findings   Reproductive/Obstetrics                             Anesthesia Physical Anesthesia Plan  ASA: 2  Anesthesia Plan: MAC and Spinal   Post-op Pain Management: Tylenol PO (pre-op)*   Induction:  Intravenous  PONV Risk Score and Plan: 1 and Ondansetron,  Dexamethasone, Treatment may vary due to age or medical condition and Propofol infusion  Airway Management Planned: Natural Airway and Simple Face Mask  Additional Equipment:   Intra-op Plan:   Post-operative Plan:   Informed Consent: I have reviewed the patients History and Physical, chart, labs and discussed the procedure including the risks, benefits and alternatives for the proposed anesthesia with the patient or authorized representative who has indicated his/her understanding and acceptance.     Dental advisory given  Plan Discussed with: CRNA and Anesthesiologist  Anesthesia Plan Comments: (I have discussed risks of neuraxial anesthesia including but not limited to infection, bleeding, nerve injury, back pain, headache, seizures, and failure of block. Patient denies bleeding disorders and is not currently anticoagulated. Labs have been reviewed. Risks and benefits discussed. All patient's questions answered.   Discussed with patient risks of MAC including, but not limited to, minor pain or discomfort, hearing people in the room, and possible need for backup general anesthesia. Risks for general anesthesia also discussed including, but not limited to, sore throat, hoarse voice, chipped/damaged teeth, injury to vocal cords, nausea and vomiting, allergic reactions, lung infection, heart attack, stroke, and death. All questions answered. )        Anesthesia Quick Evaluation

## 2023-03-29 ENCOUNTER — Observation Stay (HOSPITAL_COMMUNITY): Payer: BC Managed Care – PPO

## 2023-03-29 ENCOUNTER — Other Ambulatory Visit: Payer: Self-pay

## 2023-03-29 ENCOUNTER — Observation Stay (HOSPITAL_COMMUNITY)
Admission: RE | Admit: 2023-03-29 | Discharge: 2023-03-30 | Disposition: A | Discharge status: Home / Self Care | Payer: BC Managed Care – PPO | Attending: Orthopedic Surgery | Admitting: Orthopedic Surgery

## 2023-03-29 ENCOUNTER — Ambulatory Visit (HOSPITAL_COMMUNITY): Payer: Self-pay | Admitting: Anesthesiology

## 2023-03-29 ENCOUNTER — Encounter (HOSPITAL_COMMUNITY)
Admission: RE | Disposition: A | Discharge status: Home / Self Care | Payer: Self-pay | Source: Home / Self Care | Attending: Orthopedic Surgery

## 2023-03-29 ENCOUNTER — Encounter (HOSPITAL_COMMUNITY): Payer: Self-pay | Admitting: Orthopedic Surgery

## 2023-03-29 ENCOUNTER — Ambulatory Visit (HOSPITAL_COMMUNITY): Payer: BC Managed Care – PPO

## 2023-03-29 DIAGNOSIS — M1611 Unilateral primary osteoarthritis, right hip: Secondary | ICD-10-CM | POA: Diagnosis present

## 2023-03-29 DIAGNOSIS — Z96641 Presence of right artificial hip joint: Secondary | ICD-10-CM

## 2023-03-29 DIAGNOSIS — Z96642 Presence of left artificial hip joint: Secondary | ICD-10-CM | POA: Diagnosis not present

## 2023-03-29 DIAGNOSIS — E119 Type 2 diabetes mellitus without complications: Secondary | ICD-10-CM | POA: Insufficient documentation

## 2023-03-29 DIAGNOSIS — I10 Essential (primary) hypertension: Secondary | ICD-10-CM | POA: Insufficient documentation

## 2023-03-29 DIAGNOSIS — E039 Hypothyroidism, unspecified: Secondary | ICD-10-CM | POA: Insufficient documentation

## 2023-03-29 DIAGNOSIS — Z87891 Personal history of nicotine dependence: Secondary | ICD-10-CM | POA: Insufficient documentation

## 2023-03-29 HISTORY — PX: TOTAL HIP ARTHROPLASTY: SHX124

## 2023-03-29 LAB — GLUCOSE, CAPILLARY: Glucose-Capillary: 147 mg/dL — ABNORMAL HIGH (ref 70–99)

## 2023-03-29 SURGERY — ARTHROPLASTY, HIP, TOTAL, ANTERIOR APPROACH
Anesthesia: Monitor Anesthesia Care | Site: Hip | Laterality: Right

## 2023-03-29 MED ORDER — MIDAZOLAM HCL 2 MG/2ML IJ SOLN
INTRAMUSCULAR | Status: DC | PRN
  Administered 2023-03-29: 2 mg via INTRAVENOUS

## 2023-03-29 MED ORDER — LACTATED RINGERS IV SOLN
INTRAVENOUS | Status: DC | PRN
Start: 2023-03-29 — End: 2023-03-29

## 2023-03-29 MED ORDER — STERILE WATER FOR IRRIGATION IR SOLN
Status: DC | PRN
  Administered 2023-03-29: 1000 mL

## 2023-03-29 MED ORDER — SODIUM CHLORIDE (PF) 0.9 % IJ SOLN
INTRAMUSCULAR | Status: AC
  Filled 2023-03-29: qty 30

## 2023-03-29 MED ORDER — OXYCODONE HCL 5 MG PO TABS
2.5000 mg | ORAL_TABLET | ORAL | Status: DC | PRN
  Administered 2023-03-29: 2.5 mg via ORAL
  Administered 2023-03-30: 5 mg via ORAL
  Administered 2023-03-30: 2.5 mg via ORAL
  Filled 2023-03-29 (×4): qty 1

## 2023-03-29 MED ORDER — LIDOCAINE HCL (PF) 2 % IJ SOLN
INTRAMUSCULAR | Status: DC | PRN
  Administered 2023-03-29: 50 mg via INTRADERMAL

## 2023-03-29 MED ORDER — TAMSULOSIN HCL 0.4 MG PO CAPS
0.8000 mg | ORAL_CAPSULE | Freq: Every day | ORAL | Status: DC
  Administered 2023-03-30: 0.8 mg via ORAL
  Filled 2023-03-29 (×2): qty 2

## 2023-03-29 MED ORDER — LACTATED RINGERS IV SOLN: INTRAVENOUS | Status: DC

## 2023-03-29 MED ORDER — ACETAMINOPHEN 500 MG PO TABS
1000.0000 mg | ORAL_TABLET | Freq: Once | ORAL | Status: AC
  Administered 2023-03-29: 1000 mg via ORAL
  Filled 2023-03-29: qty 2

## 2023-03-29 MED ORDER — BUSPIRONE HCL 5 MG PO TABS
7.5000 mg | ORAL_TABLET | Freq: Two times a day (BID) | ORAL | Status: DC
  Filled 2023-03-29 (×2): qty 2

## 2023-03-29 MED ORDER — DEXAMETHASONE SODIUM PHOSPHATE 4 MG/ML IJ SOLN
INTRAMUSCULAR | Status: DC | PRN
Start: 2023-03-29 — End: 2023-03-29
  Administered 2023-03-29: 8 mg via INTRAVENOUS

## 2023-03-29 MED ORDER — METOCLOPRAMIDE HCL 5 MG/ML IJ SOLN: 5.0000 mg | Freq: Three times a day (TID) | INTRAMUSCULAR | Status: DC | PRN

## 2023-03-29 MED ORDER — SODIUM CHLORIDE 0.9% FLUSH
3.0000 mL | Freq: Two times a day (BID) | INTRAVENOUS | Status: DC
  Administered 2023-03-29 – 2023-03-30 (×2): 10 mL via INTRAVENOUS

## 2023-03-29 MED ORDER — METOCLOPRAMIDE HCL 5 MG PO TABS: 5.0000 mg | ORAL_TABLET | Freq: Three times a day (TID) | ORAL | Status: DC | PRN

## 2023-03-29 MED ORDER — TRANEXAMIC ACID 1000 MG/10ML IV SOLN
INTRAVENOUS | Status: DC | PRN
  Administered 2023-03-29: 1000 mg via INTRAVENOUS

## 2023-03-29 MED ORDER — PHENOL 1.4 % MT LIQD: 1.0000 | OROMUCOSAL | Status: DC | PRN

## 2023-03-29 MED ORDER — SODIUM CHLORIDE (PF) 0.9 % IJ SOLN
INTRAMUSCULAR | Status: DC | PRN
  Administered 2023-03-29: 61 mL

## 2023-03-29 MED ORDER — ONDANSETRON HCL 4 MG/2ML IJ SOLN
INTRAMUSCULAR | Status: DC | PRN
  Administered 2023-03-29: 4 mg via INTRAVENOUS

## 2023-03-29 MED ORDER — PHENYLEPHRINE HCL-NACL 20-0.9 MG/250ML-% IV SOLN
INTRAVENOUS | Status: DC | PRN
  Administered 2023-03-29: 40 ug/min via INTRAVENOUS

## 2023-03-29 MED ORDER — SENNA 8.6 MG PO TABS
2.0000 | ORAL_TABLET | Freq: Every day | ORAL | Status: DC
  Administered 2023-03-29: 17.2 mg via ORAL
  Filled 2023-03-29: qty 2

## 2023-03-29 MED ORDER — OXYCODONE HCL 5 MG PO TABS: 5.0000 mg | ORAL_TABLET | Freq: Once | ORAL | Status: DC | PRN

## 2023-03-29 MED ORDER — FENTANYL CITRATE (PF) 100 MCG/2ML IJ SOLN
INTRAMUSCULAR | Status: AC
  Filled 2023-03-29: qty 2

## 2023-03-29 MED ORDER — PHENYLEPHRINE 80 MCG/ML (10ML) SYRINGE FOR IV PUSH (FOR BLOOD PRESSURE SUPPORT)
PREFILLED_SYRINGE | INTRAVENOUS | Status: AC
  Filled 2023-03-29: qty 10

## 2023-03-29 MED ORDER — TRANEXAMIC ACID-NACL 1000-0.7 MG/100ML-% IV SOLN
1000.0000 mg | Freq: Once | INTRAVENOUS | Status: AC
  Administered 2023-03-29: 1000 mg via INTRAVENOUS
  Filled 2023-03-29: qty 100

## 2023-03-29 MED ORDER — BUPIVACAINE-EPINEPHRINE 0.25% -1:200000 IJ SOLN
INTRAMUSCULAR | Status: AC
  Filled 2023-03-29: qty 1

## 2023-03-29 MED ORDER — LISINOPRIL 10 MG PO TABS
10.0000 mg | ORAL_TABLET | Freq: Every day | ORAL | Status: DC
  Administered 2023-03-30: 10 mg via ORAL
  Filled 2023-03-29: qty 1

## 2023-03-29 MED ORDER — BUPIVACAINE IN DEXTROSE 0.75-8.25 % IT SOLN
INTRATHECAL | Status: DC | PRN
  Administered 2023-03-29: 1.8 mL via INTRATHECAL

## 2023-03-29 MED ORDER — DEXAMETHASONE SODIUM PHOSPHATE 10 MG/ML IJ SOLN
INTRAMUSCULAR | Status: AC
  Filled 2023-03-29: qty 1

## 2023-03-29 MED ORDER — DEXAMETHASONE SODIUM PHOSPHATE 10 MG/ML IJ SOLN
8.0000 mg | Freq: Once | INTRAMUSCULAR | Status: DC
Start: 2023-03-29 — End: 2023-03-29

## 2023-03-29 MED ORDER — PHENYLEPHRINE HCL (PRESSORS) 10 MG/ML IV SOLN
INTRAVENOUS | Status: DC | PRN
Start: 2023-03-29 — End: 2023-03-29
  Administered 2023-03-29 (×2): 80 ug via INTRAVENOUS

## 2023-03-29 MED ORDER — PROPOFOL 500 MG/50ML IV EMUL
INTRAVENOUS | Status: DC | PRN
  Administered 2023-03-29: 75 ug/kg/min via INTRAVENOUS

## 2023-03-29 MED ORDER — CEFAZOLIN SODIUM-DEXTROSE 3-4 GM/150ML-% IV SOLN
3.0000 g | INTRAVENOUS | Status: DC
  Filled 2023-03-29: qty 150

## 2023-03-29 MED ORDER — HYDROMORPHONE HCL 1 MG/ML IJ SOLN: 0.5000 mg | INTRAMUSCULAR | Status: DC | PRN

## 2023-03-29 MED ORDER — OXYCODONE HCL 5 MG/5ML PO SOLN: 5.0000 mg | Freq: Once | ORAL | Status: DC | PRN

## 2023-03-29 MED ORDER — OXYCODONE HCL 5 MG PO TABS: 5.0000 mg | ORAL_TABLET | ORAL | Status: DC | PRN

## 2023-03-29 MED ORDER — DEXAMETHASONE SODIUM PHOSPHATE 10 MG/ML IJ SOLN
10.0000 mg | Freq: Once | INTRAMUSCULAR | Status: AC
  Administered 2023-03-30: 10 mg via INTRAVENOUS
  Filled 2023-03-29: qty 1

## 2023-03-29 MED ORDER — TRAMADOL HCL 50 MG PO TABS: 50.0000 mg | ORAL_TABLET | Freq: Four times a day (QID) | ORAL | Status: DC | PRN

## 2023-03-29 MED ORDER — LIDOCAINE HCL (PF) 2 % IJ SOLN
INTRAMUSCULAR | Status: AC
  Filled 2023-03-29: qty 5

## 2023-03-29 MED ORDER — CARIPRAZINE HCL 1.5 MG PO CAPS
1.5000 mg | ORAL_CAPSULE | Freq: Every day | ORAL | Status: DC
  Administered 2023-03-30: 1.5 mg via ORAL
  Filled 2023-03-29: qty 1

## 2023-03-29 MED ORDER — PHENYLEPHRINE HCL (PRESSORS) 10 MG/ML IV SOLN
INTRAVENOUS | Status: AC
  Filled 2023-03-29: qty 1

## 2023-03-29 MED ORDER — SODIUM CHLORIDE 0.9% FLUSH: 3.0000 mL | INTRAVENOUS | Status: DC | PRN

## 2023-03-29 MED ORDER — POVIDONE-IODINE 10 % EX SWAB: 2.0000 | Freq: Once | CUTANEOUS | Status: DC

## 2023-03-29 MED ORDER — PROPOFOL 10 MG/ML IV BOLUS
INTRAVENOUS | Status: DC | PRN
  Administered 2023-03-29: 50 mg via INTRAVENOUS

## 2023-03-29 MED ORDER — AMISULPRIDE (ANTIEMETIC) 5 MG/2ML IV SOLN: 10.0000 mg | Freq: Once | INTRAVENOUS | Status: DC | PRN

## 2023-03-29 MED ORDER — HYDROMORPHONE HCL 1 MG/ML IJ SOLN: 0.2500 mg | INTRAMUSCULAR | Status: DC | PRN

## 2023-03-29 MED ORDER — BISACODYL 10 MG RE SUPP: 10.0000 mg | Freq: Every day | RECTAL | Status: DC | PRN

## 2023-03-29 MED ORDER — LEVOTHYROXINE SODIUM 50 MCG PO TABS
50.0000 ug | ORAL_TABLET | Freq: Every day | ORAL | Status: DC
  Administered 2023-03-30: 50 ug via ORAL
  Filled 2023-03-29: qty 1

## 2023-03-29 MED ORDER — CHLORHEXIDINE GLUCONATE 0.12 % MT SOLN
15.0000 mL | Freq: Once | OROMUCOSAL | Status: AC
  Administered 2023-03-29: 15 mL via OROMUCOSAL

## 2023-03-29 MED ORDER — PRAVASTATIN SODIUM 20 MG PO TABS: 40.0000 mg | ORAL_TABLET | Freq: Every day | ORAL | Status: DC

## 2023-03-29 MED ORDER — CEFAZOLIN SODIUM-DEXTROSE 2-4 GM/100ML-% IV SOLN
2.0000 g | Freq: Four times a day (QID) | INTRAVENOUS | Status: AC
  Administered 2023-03-29 (×2): 2 g via INTRAVENOUS
  Filled 2023-03-29 (×2): qty 100

## 2023-03-29 MED ORDER — KETOROLAC TROMETHAMINE 30 MG/ML IJ SOLN
INTRAMUSCULAR | Status: AC
  Filled 2023-03-29: qty 1

## 2023-03-29 MED ORDER — ONDANSETRON HCL 4 MG/2ML IJ SOLN
INTRAMUSCULAR | Status: AC
  Filled 2023-03-29: qty 2

## 2023-03-29 MED ORDER — METHOCARBAMOL 500 MG PO TABS
500.0000 mg | ORAL_TABLET | Freq: Four times a day (QID) | ORAL | Status: DC | PRN
  Administered 2023-03-29 – 2023-03-30 (×2): 500 mg via ORAL
  Filled 2023-03-29 (×2): qty 1

## 2023-03-29 MED ORDER — PROPOFOL 500 MG/50ML IV EMUL
INTRAVENOUS | Status: AC
  Filled 2023-03-29: qty 50

## 2023-03-29 MED ORDER — ORAL CARE MOUTH RINSE: 15.0000 mL | Freq: Once | OROMUCOSAL | Status: AC

## 2023-03-29 MED ORDER — DIPHENHYDRAMINE HCL 12.5 MG/5ML PO ELIX: 12.5000 mg | ORAL_SOLUTION | ORAL | Status: DC | PRN

## 2023-03-29 MED ORDER — PROPOFOL 1000 MG/100ML IV EMUL
INTRAVENOUS | Status: AC
  Filled 2023-03-29: qty 100

## 2023-03-29 MED ORDER — CEFAZOLIN SODIUM-DEXTROSE 1-4 GM/50ML-% IV SOLN
INTRAVENOUS | Status: DC | PRN
Start: 2023-03-29 — End: 2023-03-29
  Administered 2023-03-29: 3 g via INTRAVENOUS

## 2023-03-29 MED ORDER — ACETAMINOPHEN 500 MG PO TABS
1000.0000 mg | ORAL_TABLET | Freq: Four times a day (QID) | ORAL | Status: DC
  Administered 2023-03-29 – 2023-03-30 (×3): 1000 mg via ORAL
  Filled 2023-03-29 (×3): qty 2

## 2023-03-29 MED ORDER — SODIUM CHLORIDE 0.9% FLUSH
3.0000 mL | INTRAVENOUS | Status: DC | PRN
Start: 2023-03-29 — End: 2023-03-29

## 2023-03-29 MED ORDER — FENTANYL CITRATE (PF) 100 MCG/2ML IJ SOLN
INTRAMUSCULAR | Status: DC | PRN
  Administered 2023-03-29 (×2): 25 ug via INTRAVENOUS

## 2023-03-29 MED ORDER — MENTHOL 3 MG MT LOZG: 1.0000 | LOZENGE | OROMUCOSAL | Status: DC | PRN

## 2023-03-29 MED ORDER — POLYETHYLENE GLYCOL 3350 17 G PO PACK
17.0000 g | PACK | Freq: Two times a day (BID) | ORAL | Status: DC
  Administered 2023-03-29 – 2023-03-30 (×3): 17 g via ORAL
  Filled 2023-03-29 (×3): qty 1

## 2023-03-29 MED ORDER — ONDANSETRON HCL 4 MG/2ML IJ SOLN: 4.0000 mg | Freq: Four times a day (QID) | INTRAMUSCULAR | Status: DC | PRN

## 2023-03-29 MED ORDER — ALUM & MAG HYDROXIDE-SIMETH 200-200-20 MG/5ML PO SUSP: 30.0000 mL | ORAL | Status: DC | PRN

## 2023-03-29 MED ORDER — MIDAZOLAM HCL 2 MG/2ML IJ SOLN
INTRAMUSCULAR | Status: AC
  Filled 2023-03-29: qty 2

## 2023-03-29 MED ORDER — TRANEXAMIC ACID-NACL 1000-0.7 MG/100ML-% IV SOLN
1000.0000 mg | INTRAVENOUS | Status: DC
  Filled 2023-03-29: qty 100

## 2023-03-29 MED ORDER — 0.9 % SODIUM CHLORIDE (POUR BTL) OPTIME
TOPICAL | Status: DC | PRN
  Administered 2023-03-29: 1000 mL

## 2023-03-29 MED ORDER — ONDANSETRON HCL 4 MG PO TABS: 4.0000 mg | ORAL_TABLET | Freq: Four times a day (QID) | ORAL | Status: DC | PRN

## 2023-03-29 MED ORDER — SODIUM CHLORIDE 0.9% FLUSH: 3.0000 mL | Freq: Two times a day (BID) | INTRAVENOUS | Status: DC

## 2023-03-29 MED ORDER — METHOCARBAMOL 1000 MG/10ML IJ SOLN: 500.0000 mg | Freq: Four times a day (QID) | INTRAMUSCULAR | Status: DC | PRN

## 2023-03-29 MED ORDER — ASPIRIN 81 MG PO CHEW
81.0000 mg | CHEWABLE_TABLET | Freq: Two times a day (BID) | ORAL | Status: DC
  Administered 2023-03-29 – 2023-03-30 (×2): 81 mg via ORAL
  Filled 2023-03-29 (×2): qty 1

## 2023-03-29 SURGICAL SUPPLY — 39 items
BAG COUNTER SPONGE SURGICOUNT (BAG) IMPLANT
BAG ZIPLOCK 12X15 (MISCELLANEOUS) IMPLANT
BLADE SAG 18X100X1.27 (BLADE) ×1 IMPLANT
COVER PERINEAL POST (MISCELLANEOUS) ×1 IMPLANT
COVER SURGICAL LIGHT HANDLE (MISCELLANEOUS) ×1 IMPLANT
CUP ACETBLR 54 OD PINNACLE (Hips) IMPLANT
DERMABOND ADVANCED .7 DNX12 (GAUZE/BANDAGES/DRESSINGS) ×1 IMPLANT
DRAPE FOOT SWITCH (DRAPES) ×1 IMPLANT
DRAPE STERI IOBAN 125X83 (DRAPES) ×1 IMPLANT
DRAPE U-SHAPE 47X51 STRL (DRAPES) ×2 IMPLANT
DRESSING AQUACEL AG SP 3.5X10 (GAUZE/BANDAGES/DRESSINGS) ×1 IMPLANT
DRSG AQUACEL AG SP 3.5X10 (GAUZE/BANDAGES/DRESSINGS) ×1 IMPLANT
DURAPREP 26ML APPLICATOR (WOUND CARE) ×1 IMPLANT
ELECT REM PT RETURN 15FT ADLT (MISCELLANEOUS) ×1 IMPLANT
FEM STEM 12/14 TAPER SZ 4 HIP (Orthopedic Implant) ×1 IMPLANT
FEMORAL STEM 12/14 TPR SZ4 HIP (Orthopedic Implant) IMPLANT
GLOVE BIO SURGEON STRL SZ 6 (GLOVE) ×1 IMPLANT
GLOVE BIOGEL PI IND STRL 6.5 (GLOVE) ×1 IMPLANT
GLOVE BIOGEL PI IND STRL 7.5 (GLOVE) ×1 IMPLANT
GLOVE ORTHO TXT STRL SZ7.5 (GLOVE) ×2 IMPLANT
GOWN STRL REUS W/ TWL LRG LVL3 (GOWN DISPOSABLE) ×2 IMPLANT
HEAD CERAMIC 36 PLUS5 (Hips) IMPLANT
HOLDER FOLEY CATH W/STRAP (MISCELLANEOUS) ×1 IMPLANT
KIT TURNOVER KIT A (KITS) IMPLANT
LINER NEUTRAL 54X36MM PLUS 4 (Hips) IMPLANT
MANIFOLD NEPTUNE II (INSTRUMENTS) ×1 IMPLANT
NDL SAFETY ECLIPSE 18X1.5 (NEEDLE) IMPLANT
PACK ANTERIOR HIP CUSTOM (KITS) ×1 IMPLANT
SCREW 6.5MMX25MM (Screw) IMPLANT
SUT MNCRL AB 4-0 PS2 18 (SUTURE) ×1 IMPLANT
SUT STRATAFIX 0 PDS 27 VIOLET (SUTURE) ×1 IMPLANT
SUT VIC AB 1 CT1 36 (SUTURE) ×3 IMPLANT
SUT VIC AB 2-0 CT1 TAPERPNT 27 (SUTURE) ×2 IMPLANT
SUTURE STRATFX 0 PDS 27 VIOLET (SUTURE) ×1 IMPLANT
SYR 3ML LL SCALE MARK (SYRINGE) IMPLANT
TOWEL GREEN STERILE FF (TOWEL DISPOSABLE) ×1 IMPLANT
TRAY FOLEY MTR SLVR 16FR STAT (SET/KITS/TRAYS/PACK) ×1 IMPLANT
TUBE SUCTION HIGH CAP CLEAR NV (SUCTIONS) ×1 IMPLANT
WATER STERILE IRR 1000ML POUR (IV SOLUTION) ×1 IMPLANT

## 2023-03-29 NOTE — Interval H&P Note (Signed)
 History and Physical Interval Note:  03/29/2023 7:10 AM  Ryan Hancock  has presented today for surgery, with the diagnosis of Right hip osteoarthritis.  The various methods of treatment have been discussed with the patient and family. After consideration of risks, benefits and other options for treatment, the patient has consented to  Procedure(s): TOTAL HIP ARTHROPLASTY ANTERIOR APPROACH (Right) as a surgical intervention.  The patient's history has been reviewed, patient examined, no change in status, stable for surgery.  I have reviewed the patient's chart and labs.  Questions were answered to the patient's satisfaction.     [name]

## 2023-03-29 NOTE — Transfer of Care (Signed)
 Immediate Anesthesia Transfer of Care Note  Patient: Ryan Hancock  Procedure(s) Performed: TOTAL HIP ARTHROPLASTY ANTERIOR APPROACH (Right: Hip)  Patient Location: PACU  Anesthesia Type:Spinal  Level of Consciousness: awake, alert , and oriented  Airway & Oxygen Therapy: Patient Spontanous Breathing and Patient connected to face mask oxygen  Post-op Assessment: Report given to RN  Post vital signs: stable  Last Vitals:  Vitals Value Taken Time  BP 121/63 03/29/23 0955  Temp    Pulse 85 03/29/23 0956  Resp 17 03/29/23 0956  SpO2 97 % 03/29/23 0956  Vitals shown include unfiled device data.  Last Pain:  Vitals:   03/29/23 0627  TempSrc: Oral  PainSc: 0-No pain         Complications: No notable events documented.

## 2023-03-29 NOTE — H&P (Signed)
 TOTAL HIP ADMISSION H&P  Patient is admitted for right total hip arthroplasty.  Therapy Plans: HEP Disposition: Home with wife Planned DVT Prophylaxis: aspirin 81mg  BID DME needed: walker PCP: Dr. Hal Hope - clearance received TXA: IV Allergies: NKDA Anesthesia Concerns: none BMI: 37.2 Last HgbA1c: pre-diabetic  Other: - MAJOR constipation issues with last hip replacement - plan for miralax, colace, and sennakot - No hx of VTE or cancer - tramadol/oxycodone, tylenol, tizanidine, celebrex - staying overnight  Subjective:  Chief Complaint: right hip pain  HPI: Ryan Hancock, 63 y.o. male, has a history of pain and functional disability in the right hip(s) due to arthritis and patient has failed non-surgical conservative treatments for greater than 12 weeks to include NSAID's and/or analgesics and activity modification.  Onset of symptoms was gradual starting 2 years ago with gradually worsening course since that time.The patient noted no past surgery on the right hip(s).  Patient currently rates pain in the right hip at 8 out of 10 with activity. Patient has worsening of pain with activity and weight bearing, pain that interfers with activities of daily living, and pain with passive range of motion. Patient has evidence of joint space narrowing by imaging studies. This condition presents safety issues increasing the risk of falls.  There is no current active infection.  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/30/2017   Past Medical History:  Diagnosis Date   Anxiety    Arthritis    Depression    Diabetes mellitus without complication (HCC)    Gallbladder problem    Hip pain    Hyperlipidemia    Hypertension    Hypothyroidism    Joint pain    Knee pain    Other fatigue    Prediabetes    Shortness of breath on exertion    Sleep apnea     Past Surgical History:  Procedure Laterality Date   APPENDECTOMY  1994   CHOLECYSTECTOMY  1996   MENISCUS REPAIR  2002   right knee   TOTAL HIP ARTHROPLASTY Left 10/30/2017   Procedure: LEFT TOTAL HIP ARTHROPLASTY ANTERIOR APPROACH;  Surgeon: Durene Romans, MD;  Location: [facility];  Service: Orthopedics;  Laterality: Left;  70 mins    Current Facility-Administered Medications  Medication Dose Route Frequency Provider Last Rate Last Admin   ceFAZolin (ANCEF) IVPB 3g/150 mL premix  3 g Intravenous On Call to OR Cassandria Anger, PA-C       dexamethasone (DECADRON) injection 8 mg  8 mg Intravenous Once Cassandria Anger, PA-C       lactated ringers infusion   Intravenous Continuous Linton Rump, MD 10 mL/hr at 03/29/23 0643 New Bag at 03/29/23 0643   povidone-iodine 10 % swab 2 Application  2 Application Topical Once Cassandria Anger, PA-C       sodium chloride flush (NS) 0.9 % injection 3-10 mL  3-10 mL Intravenous Q12H Rosalene Billings R, PA-C       sodium chloride flush (  NS) 0.9 % injection 3-10 mL  3-10 mL Intravenous PRN Rosalene Billings R, PA-C       tranexamic acid (CYKLOKAPRON) IVPB 1,000 mg  1,000 mg Intravenous To OR Cassandria Anger, PA-C       Allergies  Allergen Reactions   Shellfish Allergy Anaphylaxis and Itching    Itchy throat    Social History   Tobacco Use   Smoking status: Former    Current packs/day: 0.00    Average packs/day: 1 pack/day for 5.1 years (5.1 ttl pk-yrs)    Types: Cigarettes    Start date: 76    Quit date: 03/20/1984    Years since quitting: 39.0   Smokeless tobacco: Never   Substance Use Topics   Alcohol use: Yes    Comment: occassionally    Family History  Problem Relation Age of Onset   Diabetes Mother    Hypertension Mother    Heart disease Mother    Stroke Mother    Depression Mother    Anxiety disorder Mother    Obesity Mother    Hypertension Father    Heart disease Father    Cancer Father    Depression Father    Anxiety disorder Father      Review of Systems  Constitutional:  Negative for chills and fever.  Respiratory:  Negative for cough and shortness of breath.   Cardiovascular:  Negative for chest pain.  Gastrointestinal:  Negative for nausea and vomiting.  Musculoskeletal:  Positive for arthralgias.     Objective:  Physical Exam Well nourished and well developed. General: Alert and oriented x3, cooperative and pleasant, no acute distress.  Musculoskeletal: Right Hip: Pain with passive hip ROM Overall preserved motion No lateral tenderness  Calves soft and nontender. Motor function intact in LE. Strength 5/5 LE bilaterally. Neuro: Distal pulses 2+. Sensation to light touch intact in LE.  Vital signs in last 24 hours: Temp:  [98.1 F (36.7 C)] 98.1 F (36.7 C) (02/20 0627) Pulse Rate:  [85] 85 (02/20 0627) Resp:  [18] 18 (02/20 0627) BP: (128)/(73) 128/73 (02/20 0627) SpO2:  [95 %] 95 % (02/20 0627) Weight:  [122.6 kg] 122.6 kg (02/20 0627)  Labs:   Estimated body mass index is 35.65 kg/m as calculated from the following:   Height as of this encounter: 6\' 1"  (1.854 m).   Weight as of this encounter: 122.6 kg.   Imaging Review Plain radiographs demonstrate severe degenerative joint disease of the right hip(s). The bone quality appears to be adequate for age and reported activity level.      Assessment/Plan:  End stage arthritis, right hip(s)  The patient history, physical examination, clinical judgement of the provider and imaging studies are consistent with end stage degenerative joint disease of the  right hip(s) and total hip arthroplasty is deemed medically necessary. The treatment options including medical management, injection therapy, arthroscopy and arthroplasty were discussed at length. The risks and benefits of total hip arthroplasty were presented and reviewed. The risks due to aseptic loosening, infection, stiffness, dislocation/subluxation,  thromboembolic complications and other imponderables were discussed.  The patient acknowledged the explanation, agreed to proceed with the plan and consent was signed. Patient is being admitted for inpatient treatment for surgery, pain control, PT, OT, prophylactic antibiotics, VTE prophylaxis, progressive ambulation and ADL's and discharge planning.The patient is planning to be discharged  home.   Rosalene Billings, PA-C Orthopedic Surgery [facility] [place] [phone]

## 2023-03-29 NOTE — Op Note (Signed)
 NAME:  Ryan Hancock                ACCOUNT NO.: [id number]      MEDICAL RECORD NO.: [id number]      FACILITY:  [facility]      PHYSICIAN:  [name]  DATE OF BIRTH:  06/15/1960     DATE OF PROCEDURE:  03/29/2023                                 OPERATIVE REPORT         PREOPERATIVE DIAGNOSIS: Right  hip osteoarthritis.      POSTOPERATIVE DIAGNOSIS:  Right hip osteoarthritis.      PROCEDURE:  Right total hip replacement through an anterior approach   utilizing DePuy THR system, component size 54 mm pinnacle cup, a size 36+4 neutral   Altrex liner, a size 4 Hi Actis stem with a 36+5  delta ceramic   ball.      SURGEON:  Madlyn Frankel. Charlann Boxer, M.D.      ASSISTANT:  Rosalene Billings, PA-C     ANESTHESIA:  Spinal.      SPECIMENS:  None.      COMPLICATIONS:  None.      BLOOD LOSS:  450 cc     DRAINS:  None.      INDICATION OF THE PROCEDURE:  Ryan Hancock is a 63 y.o. male who had   presented to office for evaluation of right hip pain.  Radiographs revealed   progressive degenerative changes with bone-on-bone   articulation of the  hip joint, including subchondral cystic changes and osteophytes.  The patient had painful limited range of   motion significantly affecting their overall quality of life and function.  The patient was failing to    respond to conservative measures including medications and/or injections and activity modification and at this point was ready   to proceed with more definitive measures.  Consent was obtained for   benefit of pain relief.  Specific risks of infection, DVT, component   failure, dislocation, neurovascular injury, and need for revision surgery were reviewed in the office.     PROCEDURE IN DETAIL:  The patient was brought to operative theater.   Once adequate anesthesia, preoperative antibiotics, 2 gm of Ancef, 1 gm of Tranexamic Acid, and 10 mg of Decadron were administered, the patient was positioned supine on the [employer]  table.  Once the patient was safely positioned with adequate padding of boney prominences we predraped out the hip, and used fluoroscopy to confirm orientation of the pelvis.      The right hip was then prepped and draped from proximal iliac crest to   mid thigh with a shower curtain technique.      Time-out was performed identifying the patient, planned procedure, and the appropriate extremity.     An incision was then made 2 cm lateral to the   anterior superior iliac spine extending over the orientation of the   tensor fascia lata muscle and sharp dissection was carried down to the   fascia of the muscle.      The fascia was then incised.  The muscle belly was identified and swept   laterally and retractor placed along the superior neck.  Following   cauterization of the circumflex vessels and removing some pericapsular   fat, a second cobra retractor was placed on the inferior neck.  A T-capsulotomy was made along the line of the   superior neck to the trochanteric fossa, then extended proximally and   distally.  Tag sutures were placed and the retractors were then placed   intracapsular.  We then identified the trochanteric fossa and   orientation of my neck cut and then made a neck osteotomy with the femur on traction.  The femoral   head was removed without difficulty or complication.  Traction was let   off and retractors were placed posterior and anterior around the   acetabulum.      The labrum and foveal tissue were debrided.  I began reaming with a 47 mm   reamer and reamed up to 53 mm reamer with good bony bed preparation and a 54 mm  cup was chosen.  The final 54 mm Pinnacle cup was then impacted under fluoroscopy to confirm the depth of penetration and orientation with respect to   Abduction and forward flexion.  A screw was placed into the ilium followed by the hole eliminator.  The final   36+4 neutral Altrex liner was impacted with good visualized rim fit.  The cup was  positioned anatomically within the acetabular portion of the pelvis.      At this point, the femur was rolled to 100 degrees.  Further capsule was   released off the inferior aspect of the femoral neck.  I then   released the superior capsule proximally.  With the leg in a neutral position the hook was placed laterally   along the femur under the vastus lateralis origin and elevated manually and then held in position using the hook attachment on the bed.  The leg was then extended and adducted with the leg rolled to 100   degrees of external rotation.  Retractors were placed along the medial calcar and posteriorly over the greater trochanter.  Once the proximal femur was fully   exposed, I used a box osteotome to set orientation.  I then began   broaching with the starting chili pepper broach and passed this by hand and then broached up to 4.  With the 4 broach in place I chose a high offset neck and did several trial reductions.  The offset was appropriate, leg lengths   appeared to be equal best matched with the +5 head ball trial confirmed radiographically.   Given these findings, I went ahead and dislocated the hip, repositioned all   retractors and positioned the right hip in the extended and abducted position.  The final 4 Hi Actis stem was   chosen and it was impacted down to the level of neck cut.  Based on this   and the trial reductions, a final 36+5 delta ceramic ball was chosen and   impacted onto a clean and dry trunnion, and the hip was reduced.  The   hip had been irrigated throughout the case again at this point.  I did   reapproximate the superior capsular leaflet to the anterior leaflet   using #1 Vicryl.  The fascia of the   tensor fascia lata muscle was then reapproximated using #1 Vicryl and #0 Stratafix sutures.  The   remaining wound was closed with 2-0 Vicryl and running 4-0 Monocryl.   The hip was cleaned, dried, and dressed sterilely using Dermabond and   Aquacel  dressing.  The patient was then brought   to recovery room in stable condition tolerating the procedure well.    Rosalene Billings, PA-C  was present for the entirety of the case involved from   preoperative positioning, perioperative retractor management, general   facilitation of the case, as well as primary wound closure as assistant.            Madlyn Frankel Charlann Boxer, M.D.        03/29/2023 8:17 AM

## 2023-03-29 NOTE — Plan of Care (Signed)
  Problem: Education: Goal: Knowledge of General Education information will improve Description: Including pain rating scale, medication(s)/side effects and non-pharmacologic comfort measures Outcome: Progressing   Problem: Activity: Goal: Risk for activity intolerance will decrease Outcome: Progressing   Problem: Safety: Goal: Ability to remain free from injury will improve Outcome: Progressing   Problem: Pain Managment: Goal: General experience of comfort will improve and/or be controlled Outcome: Progressing

## 2023-03-29 NOTE — Anesthesia Postprocedure Evaluation (Signed)
 Anesthesia Post Note  Patient: Ryan Hancock  Procedure(s) Performed: TOTAL HIP ARTHROPLASTY ANTERIOR APPROACH (Right: Hip)     Patient location during evaluation: PACU Anesthesia Type: MAC and Spinal Level of consciousness: awake Pain management: pain level controlled Vital Signs Assessment: post-procedure vital signs reviewed and stable Respiratory status: spontaneous breathing, respiratory function stable and nonlabored ventilation Cardiovascular status: blood pressure returned to baseline and stable Postop Assessment: no headache, no backache and no apparent nausea or vomiting Anesthetic complications: no   No notable events documented.  Last Vitals:  Vitals:   03/29/23 1100 03/29/23 1141  BP: 115/68 123/69  Pulse: 80 83  Resp: 11 16  Temp:  36.4 C  SpO2: 98% 99%    Last Pain:  Vitals:   03/29/23 1151  TempSrc:   PainSc: 4                  Linton Rump

## 2023-03-29 NOTE — Discharge Instructions (Signed)

## 2023-03-29 NOTE — Anesthesia Procedure Notes (Signed)
 Spinal  Patient location during procedure: OR Start time: 03/29/2023 8:20 AM End time: 03/29/2023 8:26 AM Reason for block: surgical anesthesia Staffing Performed: anesthesiologist  Anesthesiologist: Linton Rump, MD Performed by: Linton Rump, MD Authorized by: Linton Rump, MD   Preanesthetic Checklist Completed: patient identified, IV checked, site marked, risks and benefits discussed, surgical consent, monitors and equipment checked, pre-op evaluation and timeout performed Spinal Block Patient position: sitting Prep: DuraPrep Patient monitoring: blood pressure and continuous pulse ox Approach: midline Location: L3-4 Injection technique: single-shot Needle Needle type: Pencan  Needle gauge: 24 G Needle length: 9 cm Additional Notes Risks and benefits of neuraxial anesthesia including, but not limited to, infection, bleeding, local anesthetic toxicity, headache, hypotension, back pain, block failure, etc. were discussed with the patient. The patient expressed understanding and consented to the procedure. I confirmed that the patient has no bleeding disorders and is not taking blood thinners. I confirmed the patient's last platelet count with the nurse. Monitors were applied. A time-out was performed immediately prior to the procedure. Sterile technique was used throughout the whole procedure.   3 attempt(s)

## 2023-03-29 NOTE — Evaluation (Signed)
 Physical Therapy Evaluation Patient Details Name: Ryan Hancock MRN: [id number] DOB: 03-03-1960 Today's Date: 03/29/2023  History of Present Illness  Pt s/p R THR and with hx of L THR and DM  Clinical Impression  Pt s/p R THR and presents with decreased R LE strength/ROM and post op pain limiting functional mobility.  Pt should progress to dc home with family assist.        If plan is discharge home, recommend the following: A little help with walking and/or transfers;A little help with bathing/dressing/bathroom;Assistance with cooking/housework;Assist for transportation;Help with stairs or ramp for entrance   Can travel by private vehicle        Equipment Recommendations Rolling walker (2 wheels)  Recommendations for Other Services       Functional Status Assessment Patient has had a recent decline in their functional status and demonstrates the ability to make significant improvements in function in a reasonable and predictable amount of time.     Precautions / Restrictions Precautions Precautions: Fall Restrictions Weight Bearing Restrictions Per Provider Order: No Other Position/Activity Restrictions: WBAT      Mobility  Bed Mobility Overal bed mobility: Needs Assistance Bed Mobility: Supine to Sit     Supine to sit: Min assist     General bed mobility comments: cues for sequence and use of L LE to self assist    Transfers Overall transfer level: Needs assistance Equipment used: Rolling walker (2 wheels) Transfers: Sit to/from Stand Sit to Stand: Min assist           General transfer comment: cues for LE management and use of UEs to self assist    Ambulation/Gait Ambulation/Gait assistance: Min assist, Contact guard assist Gait Distance (Feet): 90 Feet Assistive device: Rolling walker (2 wheels) Gait Pattern/deviations: Step-to pattern, Decreased step length - right, Decreased step length - left, Shuffle, Trunk flexed       General Gait Details: cues  for sequence, posture and position from [employer]            Wheelchair Mobility     Tilt Bed    Modified Rankin (Stroke Patients Only)       Balance Overall balance assessment: Mild deficits observed, not formally tested                                           Pertinent Vitals/Pain Pain Assessment Pain Assessment: 0-10 Pain Score: 5  Pain Location: R hip Pain Descriptors / Indicators: Aching, Burning Pain Intervention(s): Limited activity within patient's tolerance, Premedicated before session, Monitored during session, Ice applied    Home Living Family/patient expects to be discharged to:: Private residence Living Arrangements: Spouse/significant other Available Help at Discharge: Family;Available 24 hours/day Type of Home: House Home Access: Stairs to enter Entrance Stairs-Rails: Right;Left;Can reach both Entrance Stairs-Number of Steps: 4   Home Layout: One level Home Equipment: Cane - single point      Prior Function Prior Level of Function : Independent/Modified Independent                     Extremity/Trunk Assessment   Upper Extremity Assessment Upper Extremity Assessment: Overall WFL for tasks assessed    Lower Extremity Assessment Lower Extremity Assessment: RLE deficits/detail    Cervical / Trunk Assessment Cervical / Trunk Assessment: Normal  Communication   Communication Communication: No apparent difficulties    Cognition  Arousal: Alert Behavior During Therapy: WFL for tasks assessed/performed   PT - Cognitive impairments: No apparent impairments                         Following commands: Intact       Cueing       General Comments      Exercises Total Joint Exercises Ankle Circles/Pumps: AROM, Both, 15 reps, Supine   Assessment/Plan    PT Assessment Patient needs continued PT services  PT Problem List Decreased strength;Decreased range of motion;Decreased activity  tolerance;Decreased balance;Decreased mobility;Decreased knowledge of use of DME;Pain       PT Treatment Interventions DME instruction;Gait training;Stair training;Functional mobility training;Therapeutic activities;Therapeutic exercise;Patient/family education    PT Goals (Current goals can be found in the Care Plan section)  Acute Rehab PT Goals Patient Stated Goal: Regain IND PT Goal Formulation: With patient Time For Goal Achievement: 04/05/23 Potential to Achieve Goals: Good    Frequency 7X/week     Co-evaluation               AM-PAC PT "6 Clicks" Mobility  Outcome Measure Help needed turning from your back to your side while in a flat bed without using bedrails?: A Little Help needed moving from lying on your back to sitting on the side of a flat bed without using bedrails?: A Little Help needed moving to and from a bed to a chair (including a wheelchair)?: A Little Help needed standing up from a chair using your arms (e.g., wheelchair or bedside chair)?: A Little Help needed to walk in hospital room?: A Little Help needed climbing 3-5 steps with a railing? : A Lot 6 Click Score: 17    End of Session Equipment Utilized During Treatment: Gait belt Activity Tolerance: Patient tolerated treatment well Patient left: in chair;with call bell/phone within reach;with family/visitor present Nurse Communication: Mobility status PT Visit Diagnosis: Difficulty in walking, not elsewhere classified (R26.2)    Time: 1610-9604 PT Time Calculation (min) (ACUTE ONLY): 29 min   Charges:   PT Evaluation $PT Eval Low Complexity: 1 Low PT Treatments $Gait Training: 8-22 mins PT General Charges $$ ACUTE PT VISIT: 1 Visit         Ryan Hancock PT Acute Rehabilitation Services Pager [phone] Office [phone]   Ryan Hancock 03/29/2023, 2:42 PM

## 2023-03-29 NOTE — Anesthesia Procedure Notes (Signed)
 Procedure Name: MAC Date/Time: 03/29/2023 8:30 AM  Performed by: Micki Riley, CRNAPre-anesthesia Checklist: Patient identified, Emergency Drugs available, Suction available, Patient being monitored and Timeout performed Patient Re-evaluated:Patient Re-evaluated prior to induction Oxygen Delivery Method: Simple face mask Preoxygenation: Pre-oxygenation with 100% oxygen Induction Type: IV induction

## 2023-03-30 ENCOUNTER — Encounter (HOSPITAL_COMMUNITY): Payer: Self-pay | Admitting: Orthopedic Surgery

## 2023-03-30 DIAGNOSIS — M1611 Unilateral primary osteoarthritis, right hip: Secondary | ICD-10-CM | POA: Diagnosis not present

## 2023-03-30 LAB — CBC
HCT: 37.2 % — ABNORMAL LOW (ref 39.0–52.0)
Hemoglobin: 12.8 g/dL — ABNORMAL LOW (ref 13.0–17.0)
MCH: 31.9 pg (ref 26.0–34.0)
MCHC: 34.4 g/dL (ref 30.0–36.0)
MCV: 92.8 fL (ref 80.0–100.0)
Platelets: 259 10*3/uL (ref 150–400)
RBC: 4.01 MIL/uL — ABNORMAL LOW (ref 4.22–5.81)
RDW: 12 % (ref 11.5–15.5)
WBC: 15.8 10*3/uL — ABNORMAL HIGH (ref 4.0–10.5)
nRBC: 0 % (ref 0.0–0.2)

## 2023-03-30 LAB — BASIC METABOLIC PANEL
Anion gap: 12 (ref 5–15)
BUN: 15 mg/dL (ref 8–23)
CO2: 25 mmol/L (ref 22–32)
Calcium: 8.5 mg/dL — ABNORMAL LOW (ref 8.9–10.3)
Chloride: 101 mmol/L (ref 98–111)
Creatinine, Ser: 0.77 mg/dL (ref 0.61–1.24)
GFR, Estimated: >60 mL/min (ref >60)
Glucose, Bld: 123 mg/dL — ABNORMAL HIGH (ref 70–99)
Potassium: 3.6 mmol/L (ref 3.5–5.1)
Sodium: 138 mmol/L (ref 135–145)

## 2023-03-30 MED ORDER — SENNA 8.6 MG PO TABS: 2.0000 | ORAL_TABLET | Freq: Every day | ORAL | 0 refills | Status: AC

## 2023-03-30 MED ORDER — METHOCARBAMOL 500 MG PO TABS: 500.0000 mg | ORAL_TABLET | Freq: Four times a day (QID) | ORAL | 2 refills | Status: AC | PRN

## 2023-03-30 MED ORDER — POLYETHYLENE GLYCOL 3350 17 G PO PACK: 17.0000 g | PACK | Freq: Two times a day (BID) | ORAL | 0 refills | Status: AC

## 2023-03-30 MED ORDER — OXYCODONE HCL 5 MG PO TABS
2.5000 mg | ORAL_TABLET | ORAL | 0 refills | Status: AC | PRN
Start: 2023-03-30 — End: ?

## 2023-03-30 MED ORDER — ASPIRIN 81 MG PO CHEW: 81.0000 mg | CHEWABLE_TABLET | Freq: Two times a day (BID) | ORAL | 0 refills | Status: AC

## 2023-03-30 NOTE — Progress Notes (Signed)
 Physical Therapy Treatment Patient Details Name: Ryan Hancock MRN: [id number] DOB: [birth year] Today's Date: 03/30/2023   History of Present Illness Pt s/p R THR and with hx of L THR and DM    PT Comments  Pt progressing well and eager for dc home this date.  Pt up to ambulate limited distance in hallway, negotiated stairs, reviewed written HEP, reviewed car transfers, and reviewed dressing techniques.    If plan is discharge home, recommend the following: A little help with walking and/or transfers;A little help with bathing/dressing/bathroom;Assistance with cooking/housework;Assist for transportation;Help with stairs or ramp for entrance   Can travel by private vehicle        Equipment Recommendations  Rolling walker (2 wheels)    Recommendations for Other Services       Precautions / Restrictions Precautions Precautions: Fall Restrictions Weight Bearing Restrictions Per Provider Order: No Other Position/Activity Restrictions: WBAT     Mobility  Bed Mobility Overal bed mobility: Needs Assistance Bed Mobility: Supine to Sit     Supine to sit: Contact guard, Supervision     General bed mobility comments: Up in chair and requests back to same    Transfers Overall transfer level: Needs assistance Equipment used: Rolling walker (2 wheels) Transfers: Sit to/from Stand Sit to Stand: Contact guard assist, Supervision           General transfer comment: cues for LE management and use of UEs to self assist    Ambulation/Gait Ambulation/Gait assistance: Supervision Gait Distance (Feet): 120 Feet Assistive device: Rolling walker (2 wheels) Gait Pattern/deviations: Step-to pattern, Decreased step length - right, Decreased step length - left, Shuffle, Trunk flexed       General Gait Details: min cues for sequence, posture and position from RW   Stairs Stairs: Yes Stairs assistance: Min assist Stair Management: One rail Right, Step to pattern, Forwards, With  cane Number of Stairs: 5 General stair comments: 2+3 stairs with cues for sequence   Wheelchair Mobility     Tilt Bed    Modified Rankin (Stroke Patients Only)       Balance Overall balance assessment: Mild deficits observed, not formally tested                                          Communication Communication Communication: No apparent difficulties  Cognition Arousal: Alert Behavior During Therapy: WFL for tasks assessed/performed   PT - Cognitive impairments: No apparent impairments                         Following commands: Intact      Cueing    Exercises Total Joint Exercises Ankle Circles/Pumps: AROM, Both, 15 reps, Supine Quad Sets: AROM, Both, 10 reps, Supine Heel Slides: AAROM, Right, 20 reps, Supine Hip ABduction/ADduction: AAROM, Right, 15 reps, Supine Long Arc Quad: AAROM, Right, 10 reps, Seated    General Comments        Pertinent Vitals/Pain Pain Assessment Pain Assessment: 0-10 Pain Score: 5  Pain Location: R hip Pain Descriptors / Indicators: Aching, Burning Pain Intervention(s): Limited activity within patient's tolerance, Monitored during session, Premedicated before session, Ice applied    Home Living                          Prior Function  PT Goals (current goals can now be found in the care plan section) Acute Rehab PT Goals Patient Stated Goal: Regain IND PT Goal Formulation: With patient Time For Goal Achievement: 04/05/23 Potential to Achieve Goals: Good Progress towards PT goals: Progressing toward goals    Frequency    7X/week      PT Plan      Co-evaluation              AM-PAC PT "6 Clicks" Mobility   Outcome Measure  Help needed turning from your back to your side while in a flat bed without using bedrails?: A Little Help needed moving from lying on your back to sitting on the side of a flat bed without using bedrails?: A Little Help needed moving  to and from a bed to a chair (including a wheelchair)?: A Little Help needed standing up from a chair using your arms (e.g., wheelchair or bedside chair)?: A Little Help needed to walk in hospital room?: A Little Help needed climbing 3-5 steps with a railing? : A Little 6 Click Score: 18    End of Session Equipment Utilized During Treatment: Gait belt Activity Tolerance: Patient tolerated treatment well Patient left: in chair;with call bell/phone within reach;with family/visitor present Nurse Communication: Mobility status PT Visit Diagnosis: Difficulty in walking, not elsewhere classified (R26.2)     Time: 3875-6433 PT Time Calculation (min) (ACUTE ONLY): 18 min  Charges:    $Gait Training: 8-22 mins $Therapeutic Exercise: 8-22 mins $Therapeutic Activity: 8-22 mins PT General Charges $$ ACUTE PT VISIT: 1 Visit                     [name] PT Acute Rehabilitation Services Pager [phone] Office [phone]    Kla Bily 03/30/2023, 10:53 AM

## 2023-03-30 NOTE — Progress Notes (Signed)
   Subjective: 1 Day Post-Op Procedure(s) (LRB): TOTAL HIP ARTHROPLASTY ANTERIOR APPROACH (Right) Patient reports pain as mild.   Patient seen in rounds with Dr. Charlann Boxer. Patient is resting in bed on exam this morning. Wife at the bedside. No acute events overnight. Foley catheter removed. Patient ambulated 90 feet with PT yesterday. We will continue therapy today.   Objective: Vital signs in last 24 hours: Temp:  [97.5 F (36.4 C)-98.6 F (37 C)] 97.9 F (36.6 C) (02/21 0537) Pulse Rate:  [78-99] 80 (02/21 0537) Resp:  [11-23] 15 (02/21 0537) BP: (92-124)/(57-79) 118/78 (02/21 0537) SpO2:  [94 %-99 %] 98 % (02/21 0537) FiO2 (%):  [21 %] 21 % (02/20 2111)  Intake/Output from previous day:  Intake/Output Summary (Last 24 hours) at 03/30/2023 1610 Last data filed at 03/30/2023 0600 Gross per 24 hour  Intake 2325.3 ml  Output 2100 ml  Net 225.3 ml     Intake/Output this shift: No intake/output data recorded.  Labs: Recent Labs    03/30/23 0319  HGB 12.8*   Recent Labs    03/30/23 0319  WBC 15.8*  RBC 4.01*  HCT 37.2*  PLT 259   Recent Labs    03/30/23 0319  NA 138  K 3.6  CL 101  CO2 25  BUN 15  CREATININE 0.77  GLUCOSE 123*  CALCIUM 8.5*   No results for input(s): "LABPT", "INR" in the last 72 hours.  Exam: General - Patient is Alert and Oriented Extremity - Neurologically intact Sensation intact distally Intact pulses distally Dorsiflexion/Plantar flexion intact Dressing - dressing C/D/I Motor Function - intact, moving foot and toes well on exam.   Past Medical History:  Diagnosis Date   Anxiety    Arthritis    Depression    Diabetes mellitus without complication (HCC)    Gallbladder problem    Hip pain    Hyperlipidemia    Hypertension    Hypothyroidism    Joint pain    Knee pain    Other fatigue    Prediabetes    Shortness of breath on exertion    Sleep apnea     Assessment/Plan: 1 Day Post-Op Procedure(s) (LRB): TOTAL HIP  ARTHROPLASTY ANTERIOR APPROACH (Right) Principal Problem:   S/P total right hip arthroplasty  Estimated body mass index is 35.65 kg/m as calculated from the following:   Height as of this encounter: 6\' 1"  (1.854 m).   Weight as of this encounter: 122.6 kg. Advance diet Up with therapy D/C IV fluids  DVT Prophylaxis - Aspirin Weight bearing as tolerated.  Hgb stable at 12.8 this AM.  Plan is to go Home after hospital stay. Plan for discharge today after meeting goals with therapy. Follow up in the office in 2 weeks.   Rosalene Billings, PA-C Orthopedic Surgery [phone] 03/30/2023, 7:22 AM

## 2023-03-30 NOTE — Progress Notes (Signed)
 Physical Therapy Treatment Patient Details Name: Ryan Hancock MRN: [id number] DOB: [birth year] Today's Date: 03/30/2023   History of Present Illness Pt s/p R THR and with hx of L THR and DM    PT Comments  Pt motivated and progressing well with increased distance ambulated, increased stability and HEP initiated.    If plan is discharge home, recommend the following: A little help with walking and/or transfers;A little help with bathing/dressing/bathroom;Assistance with cooking/housework;Assist for transportation;Help with stairs or ramp for entrance   Can travel by private vehicle        Equipment Recommendations  Rolling walker (2 wheels)    Recommendations for Other Services       Precautions / Restrictions Precautions Precautions: Fall Restrictions Weight Bearing Restrictions Per Provider Order: No Other Position/Activity Restrictions: WBAT     Mobility  Bed Mobility Overal bed mobility: Needs Assistance Bed Mobility: Supine to Sit     Supine to sit: Contact guard, Supervision     General bed mobility comments: cues for sequence and use of L LE to self assist    Transfers Overall transfer level: Needs assistance Equipment used: Rolling walker (2 wheels) Transfers: Sit to/from Stand Sit to Stand: Contact guard assist           General transfer comment: cues for LE management and use of UEs to self assist    Ambulation/Gait Ambulation/Gait assistance: Contact guard assist, Supervision Gait Distance (Feet): 150 Feet Assistive device: Rolling walker (2 wheels) Gait Pattern/deviations: Step-to pattern, Decreased step length - right, Decreased step length - left, Shuffle, Trunk flexed       General Gait Details: min cues for sequence, posture and position from [employer]             Wheelchair Mobility     Tilt Bed    Modified Rankin (Stroke Patients Only)       Balance Overall balance assessment: Mild deficits observed, not formally  tested                                          Communication Communication Communication: No apparent difficulties  Cognition Arousal: Alert Behavior During Therapy: WFL for tasks assessed/performed   PT - Cognitive impairments: No apparent impairments                         Following commands: Intact      Cueing    Exercises Total Joint Exercises Ankle Circles/Pumps: AROM, Both, 15 reps, Supine Quad Sets: AROM, Both, 10 reps, Supine Heel Slides: AAROM, Right, 20 reps, Supine Hip ABduction/ADduction: AAROM, Right, 15 reps, Supine Long Arc Quad: AAROM, Right, 10 reps, Seated    General Comments        Pertinent Vitals/Pain Pain Assessment Pain Assessment: 0-10 Pain Score: 4  Pain Location: R hip Pain Descriptors / Indicators: Aching, Burning Pain Intervention(s): Limited activity within patient's tolerance, Monitored during session, Premedicated before session, Ice applied    Home Living                          Prior Function            PT Goals (current goals can now be found in the care plan section) Acute Rehab PT Goals Patient Stated Goal: Regain IND PT Goal Formulation: With patient Time For  Goal Achievement: 04/05/23 Potential to Achieve Goals: Good Progress towards PT goals: Progressing toward goals    Frequency    7X/week      PT Plan      Co-evaluation              AM-PAC PT "6 Clicks" Mobility   Outcome Measure  Help needed turning from your back to your side while in a flat bed without using bedrails?: A Little Help needed moving from lying on your back to sitting on the side of a flat bed without using bedrails?: A Little Help needed moving to and from a bed to a chair (including a wheelchair)?: A Little Help needed standing up from a chair using your arms (e.g., wheelchair or bedside chair)?: A Little Help needed to walk in hospital room?: A Little Help needed climbing 3-5 steps with a  railing? : A Lot 6 Click Score: 17    End of Session Equipment Utilized During Treatment: Gait belt Activity Tolerance: Patient tolerated treatment well Patient left: in chair;with call bell/phone within reach;with family/visitor present Nurse Communication: Mobility status PT Visit Diagnosis: Difficulty in walking, not elsewhere classified (R26.2)     Time: 9562-1308 PT Time Calculation (min) (ACUTE ONLY): 26 min  Charges:    $Gait Training: 8-22 mins $Therapeutic Exercise: 8-22 mins PT General Charges $$ ACUTE PT VISIT: 1 Visit                     Ryan Hancock PT Acute Rehabilitation Services Pager [phone] Office [phone]    Ryan Hancock 03/30/2023, 10:49 AM

## 2023-03-30 NOTE — TOC Transition Note (Signed)
 Transition of Care [facility]) - Discharge Note  Patient Details  Name: Ryan Hancock MRN: [id number] Date of Birth: Jul 10, 1960  Transition of [facility] CM/SW Contact:  Ewing Schlein, LCSW Phone Number: 03/30/2023, 9:19 AM  Clinical Narrative: Patient is expected to discharge home after working with PT. CSW met with patient and spouse, Ryan Hancock, to confirm discharge plan and needs. Patient will go home with home exercise program (HEP). Patient will need a rolling walker. [employer] delivered rolling walker to room. TOC signing off.    Final next level of care: Home/Self Care Barriers to Discharge: No Barriers Identified  Patient Goals and CMS Choice Patient states their goals for this hospitalization and ongoing recovery are:: Discharge home with HEP CMS Medicare.gov Compare Post Acute Care list provided to:: Patient Choice offered to / list presented to : Patient  Discharge Plan and Services Additional resources added to the After Visit Summary for      DME Arranged: Walker rolling DME Agency: [employer] Representative spoke with at DME Agency: Prearranged in orthopedist's office  Social Drivers of Health (SDOH) Interventions SDOH Screenings   Food Insecurity: Patient Declined (03/29/2023)  Housing: Patient Declined (03/29/2023)  Transportation Needs: Patient Declined (03/29/2023)  Utilities: Patient Declined (03/29/2023)  Depression (PHQ2-9): Medium Risk (04/22/2020)  Social Connections: Patient Declined (03/29/2023)  Tobacco Use: Medium Risk (03/29/2023)   Readmission Risk Interventions     No data to display

## 2023-04-10 NOTE — Discharge Summary (Signed)
 Patient ID: Ryan Hancock MRN: [id number] DOB/AGE: 11-06-1960 63 y.o.  Admit date: 03/29/2023 Discharge date: 03/30/2023  Admission Diagnoses:  Right hip osteoarthritis  Discharge Diagnoses:  Principal Problem:   S/P total right hip arthroplasty   Past Medical History:  Diagnosis Date   Anxiety    Arthritis    Depression    Diabetes mellitus without complication (HCC)    Gallbladder problem    Hip pain    Hyperlipidemia    Hypertension    Hypothyroidism    Joint pain    Knee pain    Other fatigue    Prediabetes    Shortness of breath on exertion    Sleep apnea     Surgeries: Procedure(s): TOTAL HIP ARTHROPLASTY ANTERIOR APPROACH on 03/29/2023   Consultants:   Discharged Condition: Improved  Hospital Course: Ryan Hancock is an 63 y.o. male who was admitted 03/29/2023 for operative treatment ofS/P total right hip arthroplasty. Patient has severe unremitting pain that affects sleep, daily activities, and work/hobbies. After pre-op clearance the patient was taken to the operating room on 03/29/2023 and underwent  Procedure(s): TOTAL HIP ARTHROPLASTY ANTERIOR APPROACH.    Patient was given perioperative antibiotics:  Anti-infectives (From admission, onward)    Start     Dose/Rate Route Frequency Ordered Stop   03/29/23 1430  ceFAZolin (ANCEF) IVPB 2g/100 mL premix        2 g 200 mL/hr over 30 Minutes Intravenous Every 6 hours 03/29/23 1148 03/29/23 2226   03/29/23 0615  ceFAZolin (ANCEF) IVPB 3g/150 mL premix  Status:  Discontinued        3 g 300 mL/hr over 30 Minutes Intravenous On call to O.R. 03/29/23 7829 03/29/23 1137        Patient was given sequential compression devices, early ambulation, and chemoprophylaxis to prevent DVT. Patient worked with PT and was meeting their goals regarding safe ambulation and transfers.  Patient benefited maximally from hospital stay and there were no complications.    Recent vital signs: No data found.   Recent laboratory studies:  No results for input(s): "WBC", "HGB", "HCT", "PLT", "NA", "K", "CL", "CO2", "BUN", "CREATININE", "GLUCOSE", "INR", "CALCIUM" in the last 72 hours.  Invalid input(s): "PT", "2"   Discharge Medications:   Allergies as of 03/30/2023       Reactions   Shellfish Allergy Anaphylaxis, Itching   Itchy throat        Medication List     TAKE these medications    aspirin 81 MG chewable tablet Chew 1 tablet (81 mg total) by mouth 2 (two) times daily for 28 days.   busPIRone 7.5 MG tablet Commonly known as: BUSPAR Take 7.5 mg by mouth 2 (two) times daily. May take a third 7.5 mg dose as needed for anxiety   FISH OIL PO Take 3,000 mg by mouth daily.   levothyroxine 50 MCG tablet Commonly known as: SYNTHROID Take 50 mcg by mouth daily before breakfast.   lisinopril 10 MG tablet Commonly known as: ZESTRIL Take 10 mg by mouth daily.   methocarbamol 500 MG tablet Commonly known as: ROBAXIN Take 1 tablet (500 mg total) by mouth every 6 (six) hours as needed for muscle spasms.   multivitamin with minerals Tabs tablet Take 1 tablet by mouth daily.   oxyCODONE 5 MG immediate release tablet Commonly known as: Oxy IR/ROXICODONE Take 0.5-1 tablets (2.5-5 mg total) by mouth every 4 (four) hours as needed for severe pain (pain score 7-10).   polyethylene glycol 17 g  packet Commonly known as: MIRALAX / GLYCOLAX Take 17 g by mouth 2 (two) times daily.   pravastatin 40 MG tablet Commonly known as: PRAVACHOL Take 40 mg by mouth daily.   senna 8.6 MG Tabs tablet Commonly known as: SENOKOT Take 2 tablets (17.2 mg total) by mouth at bedtime.   tamsulosin 0.4 MG Caps capsule Commonly known as: FLOMAX Take 0.8 mg by mouth daily.   Vraylar 1.5 MG capsule Generic drug: cariprazine Take 1.5 mg by mouth daily.               Discharge Care Instructions  (From admission, onward)           Start     Ordered   03/30/23 0000  Change dressing       Comments: Maintain surgical  dressing until follow up in the clinic. If the edges start to pull up, may reinforce with tape. If the dressing is no longer working, may remove and cover with gauze and tape, but must keep the area dry and clean.  Call with any questions or concerns.   03/30/23 0726            Diagnostic Studies: DG Pelvis Portable Result Date: 03/29/2023 CLINICAL DATA:  Status post right hip arthroplasty. EXAM: PORTABLE PELVIS 1-2 VIEWS COMPARISON:  None Available. FINDINGS: Right hip arthroplasty in expected alignment. No periprosthetic lucency or fracture. Recent postsurgical change includes air and edema in the soft tissues. Previous left hip arthroplasty. IMPRESSION: Right hip arthroplasty without immediate postoperative complication. Electronically Signed   By: Narda Rutherford M.D.   On: 03/29/2023 10:36   DG HIP UNILAT WITH PELVIS 1V RIGHT Result Date: 03/29/2023 CLINICAL DATA:  Elective surgery. EXAM: DG HIP (WITH OR WITHOUT PELVIS) 1V RIGHT COMPARISON:  None Available. FINDINGS: Eleven fluoroscopic spot views of the pelvis and right hip obtained in the operating room. Sequential images during hip arthroplasty. Fluoroscopy time 10 seconds. Dose 2.21 mGy. Previous left hip arthroplasty. IMPRESSION: Intraoperative fluoroscopy during right hip arthroplasty. Electronically Signed   By: Narda Rutherford M.D.   On: 03/29/2023 10:18   DG C-Arm 1-60 Min-No Report Result Date: 03/29/2023 Fluoroscopy was utilized by the requesting physician.  No radiographic interpretation.   DG C-Arm 1-60 Min-No Report Result Date: 03/29/2023 Fluoroscopy was utilized by the requesting physician.  No radiographic interpretation.    Disposition: Discharge disposition: 01-Home or Self Care       Discharge Instructions     Call MD / Call 911   Complete by: As directed    If you experience chest pain or shortness of breath, CALL 911 and be transported to the hospital emergency room.  If you develope a fever above 101 F,  pus (white drainage) or increased drainage or redness at the wound, or calf pain, call your surgeon's office.   Change dressing   Complete by: As directed    Maintain surgical dressing until follow up in the clinic. If the edges start to pull up, may reinforce with tape. If the dressing is no longer working, may remove and cover with gauze and tape, but must keep the area dry and clean.  Call with any questions or concerns.   Constipation Prevention   Complete by: As directed    Drink plenty of fluids.  Prune juice may be helpful.  You may use a stool softener, such as Colace (over the counter) 100 mg twice a day.  Use MiraLax (over the counter) for constipation as needed.  Diet - low sodium heart healthy   Complete by: As directed    Increase activity slowly as tolerated   Complete by: As directed    Weight bearing as tolerated with assist device (walker, cane, etc) as directed, use it as long as suggested by your surgeon or therapist, typically at least 4-6 weeks.   Post-operative opioid taper instructions:   Complete by: As directed    POST-OPERATIVE OPIOID TAPER INSTRUCTIONS: It is important to wean off of your opioid medication as soon as possible. If you do not need pain medication after your surgery it is ok to stop day one. Opioids include: Codeine, Hydrocodone(Norco, Vicodin), Oxycodone(Percocet, oxycontin) and hydromorphone amongst others.  Long term and even short term use of opiods can cause: Increased pain response Dependence Constipation Depression Respiratory depression And more.  Withdrawal symptoms can include Flu like symptoms Nausea, vomiting And more Techniques to manage these symptoms Hydrate well Eat regular healthy meals Stay active Use relaxation techniques(deep breathing, meditating, yoga) Do Not substitute Alcohol to help with tapering If you have been on opioids for less than two weeks and do not have pain than it is ok to stop all together.  Plan to  wean off of opioids This plan should start within one week post op of your joint replacement. Maintain the same interval or time between taking each dose and first decrease the dose.  Cut the total daily intake of opioids by one tablet each day Next start to increase the time between doses. The last dose that should be eliminated is the evening dose.      TED hose   Complete by: As directed    Use stockings (TED hose) for 2 weeks on both leg(s).  You may remove them at night for sleeping.        Follow-up Information     Durene Romans, MD. Schedule an appointment as soon as possible for a visit in 2 week(s).   Specialty: Orthopedic Surgery Contact information: [address] [place] [address] [place] [address] [phone]                  Signed: Cassandria Anger 04/10/2023, 4:11 PM

## 2023-05-15 ENCOUNTER — Telehealth (HOSPITAL_COMMUNITY): Payer: Self-pay | Admitting: Psychiatry

## 2023-05-15 NOTE — Telephone Encounter (Signed)
 D:  Patient along with wife called to inquire about MH-IOP.  Referred per PCP (Dr. Hal Hope and Dr. Chester Holstein).  A:  Oriented pt.  Patient and wife states they would prefer an in-person group for pt.  "I don't think he needs virtual, he needs something in person." Wife inquired about in person places.  Provided pt and wife with [facility] and [facility] information.  Encouraged them to call the cm back if he changes his mind.  R:  Pt receptive.
